# Patient Record
Sex: Male | Born: 1970 | Race: Black or African American | Hispanic: No | Marital: Married | Smoking: Never smoker
Health system: Southern US, Community
[De-identification: ages and names within clinical notes are randomized; demographics above are authoritative.]

## PROBLEM LIST (undated history)

## (undated) DIAGNOSIS — N182 Chronic kidney disease, stage 2 (mild): Secondary | ICD-10-CM

## (undated) DIAGNOSIS — E669 Obesity, unspecified: Secondary | ICD-10-CM

## (undated) DIAGNOSIS — I509 Heart failure, unspecified: Secondary | ICD-10-CM

## (undated) DIAGNOSIS — E119 Type 2 diabetes mellitus without complications: Secondary | ICD-10-CM

## (undated) DIAGNOSIS — I10 Essential (primary) hypertension: Secondary | ICD-10-CM

## (undated) DIAGNOSIS — I2510 Atherosclerotic heart disease of native coronary artery without angina pectoris: Secondary | ICD-10-CM

---

## 2014-04-03 ENCOUNTER — Encounter (HOSPITAL_BASED_OUTPATIENT_CLINIC_OR_DEPARTMENT_OTHER): Payer: Self-pay | Admitting: Emergency Medicine

## 2014-04-03 ENCOUNTER — Emergency Department (HOSPITAL_BASED_OUTPATIENT_CLINIC_OR_DEPARTMENT_OTHER): Payer: Medicaid Other

## 2014-04-03 ENCOUNTER — Emergency Department (HOSPITAL_BASED_OUTPATIENT_CLINIC_OR_DEPARTMENT_OTHER)
Admission: EM | Admit: 2014-04-03 | Discharge: 2014-04-03 | Disposition: A | Discharge status: Home / Self Care | Payer: Medicaid Other | Attending: Emergency Medicine | Admitting: Emergency Medicine

## 2014-04-03 ENCOUNTER — Other Ambulatory Visit: Payer: Self-pay

## 2014-04-03 ENCOUNTER — Other Ambulatory Visit: Payer: Medicaid Other

## 2014-04-03 DIAGNOSIS — R079 Chest pain, unspecified: Secondary | ICD-10-CM | POA: Diagnosis present

## 2014-04-03 DIAGNOSIS — I2510 Atherosclerotic heart disease of native coronary artery without angina pectoris: Secondary | ICD-10-CM | POA: Insufficient documentation

## 2014-04-03 DIAGNOSIS — I509 Heart failure, unspecified: Secondary | ICD-10-CM | POA: Insufficient documentation

## 2014-04-03 DIAGNOSIS — I10 Essential (primary) hypertension: Secondary | ICD-10-CM | POA: Diagnosis not present

## 2014-04-03 DIAGNOSIS — Z79899 Other long term (current) drug therapy: Secondary | ICD-10-CM | POA: Diagnosis not present

## 2014-04-03 DIAGNOSIS — E119 Type 2 diabetes mellitus without complications: Secondary | ICD-10-CM | POA: Diagnosis not present

## 2014-04-03 HISTORY — DX: Atherosclerotic heart disease of native coronary artery without angina pectoris: I25.10

## 2014-04-03 HISTORY — DX: Essential (primary) hypertension: I10

## 2014-04-03 HISTORY — DX: Type 2 diabetes mellitus without complications: E11.9

## 2014-04-03 HISTORY — DX: Heart failure, unspecified: I50.9

## 2014-04-03 LAB — CBC WITH DIFFERENTIAL/PLATELET
BASOS PCT: 0 % (ref 0–1)
Basophils Absolute: 0.1 10*3/uL (ref 0.0–0.1)
EOS ABS: 0.4 10*3/uL (ref 0.0–0.7)
EOS PCT: 3 % (ref 0–5)
HCT: 41.1 % (ref 39.0–52.0)
HEMOGLOBIN: 14.4 g/dL (ref 13.0–17.0)
Lymphocytes Relative: 16 % (ref 12–46)
Lymphs Abs: 1.8 10*3/uL (ref 0.7–4.0)
MCH: 29.7 pg (ref 26.0–34.0)
MCHC: 35 g/dL (ref 30.0–36.0)
MCV: 84.7 fL (ref 78.0–100.0)
Monocytes Absolute: 0.9 10*3/uL (ref 0.1–1.0)
Monocytes Relative: 7 % (ref 3–12)
NEUTROS ABS: 8.5 10*3/uL — AB (ref 1.7–7.7)
Neutrophils Relative %: 74 % (ref 43–77)
Platelets: 174 10*3/uL (ref 150–400)
RBC: 4.85 MIL/uL (ref 4.22–5.81)
RDW: 12.2 % (ref 11.5–15.5)
WBC: 11.6 10*3/uL — AB (ref 4.0–10.5)

## 2014-04-03 LAB — TROPONIN I

## 2014-04-03 LAB — COMPREHENSIVE METABOLIC PANEL
ALT: 44 U/L (ref 0–53)
ANION GAP: 15 (ref 5–15)
AST: 29 U/L (ref 0–37)
Albumin: 3.6 g/dL (ref 3.5–5.2)
Alkaline Phosphatase: 98 U/L (ref 39–117)
BUN: 18 mg/dL (ref 6–23)
CALCIUM: 9 mg/dL (ref 8.4–10.5)
CO2: 26 mEq/L (ref 19–32)
CREATININE: 1.3 mg/dL (ref 0.50–1.35)
Chloride: 102 mEq/L (ref 96–112)
GFR calc Af Amer: 77 mL/min — ABNORMAL LOW (ref >90)
GFR calc non Af Amer: 67 mL/min — ABNORMAL LOW (ref >90)
Glucose, Bld: 117 mg/dL — ABNORMAL HIGH (ref 70–99)
Potassium: 3.3 mEq/L — ABNORMAL LOW (ref 3.7–5.3)
Sodium: 143 mEq/L (ref 137–147)
TOTAL PROTEIN: 7.3 g/dL (ref 6.0–8.3)
Total Bilirubin: 0.7 mg/dL (ref 0.3–1.2)

## 2014-04-03 LAB — PRO B NATRIURETIC PEPTIDE: Pro B Natriuretic peptide (BNP): 1514 pg/mL — ABNORMAL HIGH (ref 0–125)

## 2014-04-03 MED ORDER — ASPIRIN 325 MG PO TABS
325.0000 mg | ORAL_TABLET | Freq: Once | ORAL | Status: AC
  Administered 2014-04-03: 325 mg via ORAL
  Filled 2014-04-03: qty 1

## 2014-04-03 MED ORDER — FUROSEMIDE 10 MG/ML IJ SOLN
40.0000 mg | Freq: Once | INTRAMUSCULAR | Status: AC
  Administered 2014-04-03: 40 mg via INTRAVENOUS
  Filled 2014-04-03: qty 4

## 2014-04-03 NOTE — ED Notes (Signed)
Pt reports chest pressure x1 week - reports increased SOB - pt reports newly diagnosed with fluid around his heart and lungs and placed on 'water pills.' EKG obtained.

## 2014-04-03 NOTE — ED Provider Notes (Signed)
CSN: [id number]     Arrival date & time 04/03/14  1520 History  This chart was scribed for Brett Horn, MD by Milly Jakob, ED Scribe. The patient was seen in room MH08/MH08. Patient's care was started at 3:40 PM.     Chief Complaint  Patient presents with  . Chest Pain   The history is provided by the patient. No language interpreter was used.   HPI Comments: Giancarlos Berendt is a 43 y.o. male with a history of HTN who presents to the Emergency Department complaining of gradual onset chest pressure with associated SOB for several months and in a stable pattern for 2 months. These symptoms are exacerbated by exertion (walking 20 yards) and laying down. These symptoms resolve upon rest. He is without other associated symptoms. He reports being seen for this at [facility] 1 week ago. He states that they started him on Lasix in addition to the hydrochlorothiazide that he has taken for 1 year for HTN. He denies fever, emesis, syncope. He reports seeing a PCP at [facility], but that he was unable to get an appointment for this problem.   Past Medical History  Diagnosis Date  . Hypertension   . Diabetes mellitus without complication   . Coronary artery disease   . CHF (congestive heart failure)    History reviewed. No pertinent past surgical history. History reviewed. No pertinent family history. History  Substance Use Topics  . Smoking status: Never Smoker   . Smokeless tobacco: Not on file  . Alcohol Use: No    Review of Systems  10 Systems reviewed and are negative for acute change except as noted in the HPI.   Allergies  Review of patient's allergies indicates no known allergies.  Home Medications   Prior to Admission medications   Medication Sig Start Date End Date Taking? Authorizing Provider  atorvastatin (LIPITOR) 20 MG tablet Take 20 mg by mouth daily.   Yes Historical Provider, MD  furosemide (LASIX) 20 MG tablet Take 20 mg by mouth daily.   Yes Historical  Provider, MD  hydrochlorothiazide (HYDRODIURIL) 25 MG tablet Take 25 mg by mouth daily.   Yes Historical Provider, MD  lisinopril (PRINIVIL,ZESTRIL) 20 MG tablet Take 20 mg by mouth daily.   Yes Historical Provider, MD  metoprolol (LOPRESSOR) 100 MG tablet Take 100 mg by mouth 2 (two) times daily.   Yes Historical Provider, MD  potassium chloride SA (K-DUR,KLOR-CON) 20 MEQ tablet Take 20 mEq by mouth daily.   Yes Historical Provider, MD  sitaGLIPtin-metformin (JANUMET) 50-1000 MG per tablet Take 1 tablet by mouth daily.   Yes Historical Provider, MD   Triage Vitals: BP 180/100  Pulse 73  Temp(Src) 98.4 F (36.9 C) (Oral)  Resp 21  Ht 6\' 1"  (1.854 m)  Wt 364 lb (165.109 kg)  BMI 48.03 kg/m2  SpO2 93% Physical Exam  Nursing note and vitals reviewed. Constitutional:  Awake, alert, nontoxic appearance.  HENT:  Head: Atraumatic.  Eyes: Right eye exhibits no discharge. Left eye exhibits no discharge.  Neck: Neck supple.  Cardiovascular: Normal rate, regular rhythm and normal heart sounds.   No murmur heard. Pulmonary/Chest: Effort normal and breath sounds normal. No respiratory distress. He has no wheezes. He has no rales. He exhibits no tenderness.  Abdominal: Soft. Bowel sounds are normal. He exhibits no distension. There is no tenderness. There is no rebound and no guarding.  Musculoskeletal: He exhibits no tenderness.  Baseline ROM, no obvious new focal weakness.  Neurological:  Mental status and motor strength appears baseline for patient and situation.  Skin: No rash noted.  Psychiatric: He has a normal mood and affect.    ED Course  Procedures (including critical care time) DIAGNOSTIC STUDIES: Oxygen Saturation is 93% on room air, adquate by my interpretation.    COORDINATION OF CARE: Patient / Family / Caregiver understand and agree with initial ED impression and plan with expectations set for ED visit.  Patient / Family / Caregiver informed of clinical course,  understand medical decision-making process, and agree with plan. D/w Cards for OutPt f/u. Labs Review Labs Reviewed  COMPREHENSIVE METABOLIC PANEL - Abnormal; Notable for the following:    Potassium 3.3 (*)    Glucose, Bld 117 (*)    GFR calc non Af Amer 67 (*)    GFR calc Af Amer 77 (*)    All other components within normal limits  CBC WITH DIFFERENTIAL - Abnormal; Notable for the following:    WBC 11.6 (*)    Neutro Abs 8.5 (*)    All other components within normal limits  PRO B NATRIURETIC PEPTIDE - Abnormal; Notable for the following:    Pro B Natriuretic peptide (BNP) 1514.0 (*)    All other components within normal limits  TROPONIN I    Imaging Review Dg Chest 2 View  04/03/2014   CLINICAL DATA:  Chronic shortness of breath, now increased with sitting and standing. Chest pressure.  EXAM: CHEST  2 VIEW  COMPARISON:  03/28/2014.  FINDINGS: Trachea is midline. Heart size normal. Lungs are somewhat low in volume. There may be mild bibasilar interstitial prominence and indistinctness. Tiny bilateral effusions.  IMPRESSION: Question mild dependent pulmonary edema with tiny bilateral effusions.   Electronically Signed   By: Leanna BattlesMelinda  Blietz M.D.   On: 04/03/2014 16:16     EKG Interpretation None     Muse not working: Sinus rhythm with first degree AV block, ventricular rate 75, borderline right axis deviation, septal Q waves, T wave abnormality inferior leads, no comparison ECG available MDM   Final diagnoses:  Chronic heart failure, unspecified heart failure type    I doubt any other EMC precluding discharge at this time including, but not necessarily limited to the following:AMI, ACS.  I personally performed the services described in this documentation, which was scribed in my presence. The recorded information has been reviewed and is accurate.    Brett HornJohn M Liberato Stansbery, MD 04/04/14 2206

## 2015-10-24 ENCOUNTER — Inpatient Hospital Stay (HOSPITAL_BASED_OUTPATIENT_CLINIC_OR_DEPARTMENT_OTHER)
Admission: EM | Admit: 2015-10-24 | Discharge: 2015-10-26 | DRG: 280 | Disposition: A | Discharge status: Home / Self Care | Payer: Medicaid Other | Attending: Internal Medicine | Admitting: Internal Medicine

## 2015-10-24 ENCOUNTER — Emergency Department (HOSPITAL_BASED_OUTPATIENT_CLINIC_OR_DEPARTMENT_OTHER): Payer: Medicaid Other

## 2015-10-24 ENCOUNTER — Encounter (HOSPITAL_BASED_OUTPATIENT_CLINIC_OR_DEPARTMENT_OTHER): Payer: Self-pay | Admitting: Emergency Medicine

## 2015-10-24 DIAGNOSIS — Z6841 Body Mass Index (BMI) 40.0 and over, adult: Secondary | ICD-10-CM | POA: Diagnosis not present

## 2015-10-24 DIAGNOSIS — I248 Other forms of acute ischemic heart disease: Secondary | ICD-10-CM | POA: Diagnosis present

## 2015-10-24 DIAGNOSIS — R51 Headache: Secondary | ICD-10-CM | POA: Diagnosis present

## 2015-10-24 DIAGNOSIS — I440 Atrioventricular block, first degree: Secondary | ICD-10-CM | POA: Diagnosis present

## 2015-10-24 DIAGNOSIS — E785 Hyperlipidemia, unspecified: Secondary | ICD-10-CM | POA: Diagnosis present

## 2015-10-24 DIAGNOSIS — R0602 Shortness of breath: Secondary | ICD-10-CM | POA: Diagnosis present

## 2015-10-24 DIAGNOSIS — Z7984 Long term (current) use of oral hypoglycemic drugs: Secondary | ICD-10-CM | POA: Diagnosis not present

## 2015-10-24 DIAGNOSIS — E1165 Type 2 diabetes mellitus with hyperglycemia: Secondary | ICD-10-CM | POA: Diagnosis present

## 2015-10-24 DIAGNOSIS — R7989 Other specified abnormal findings of blood chemistry: Secondary | ICD-10-CM

## 2015-10-24 DIAGNOSIS — I130 Hypertensive heart and chronic kidney disease with heart failure and stage 1 through stage 4 chronic kidney disease, or unspecified chronic kidney disease: Secondary | ICD-10-CM | POA: Diagnosis present

## 2015-10-24 DIAGNOSIS — Z9111 Patient's noncompliance with dietary regimen: Secondary | ICD-10-CM | POA: Diagnosis not present

## 2015-10-24 DIAGNOSIS — R0600 Dyspnea, unspecified: Secondary | ICD-10-CM | POA: Diagnosis not present

## 2015-10-24 DIAGNOSIS — I5031 Acute diastolic (congestive) heart failure: Secondary | ICD-10-CM | POA: Insufficient documentation

## 2015-10-24 DIAGNOSIS — E119 Type 2 diabetes mellitus without complications: Secondary | ICD-10-CM

## 2015-10-24 DIAGNOSIS — F329 Major depressive disorder, single episode, unspecified: Secondary | ICD-10-CM | POA: Diagnosis present

## 2015-10-24 DIAGNOSIS — J9601 Acute respiratory failure with hypoxia: Secondary | ICD-10-CM | POA: Diagnosis not present

## 2015-10-24 DIAGNOSIS — M7989 Other specified soft tissue disorders: Secondary | ICD-10-CM | POA: Diagnosis present

## 2015-10-24 DIAGNOSIS — I5033 Acute on chronic diastolic (congestive) heart failure: Secondary | ICD-10-CM | POA: Diagnosis present

## 2015-10-24 DIAGNOSIS — R05 Cough: Secondary | ICD-10-CM | POA: Diagnosis present

## 2015-10-24 DIAGNOSIS — J9600 Acute respiratory failure, unspecified whether with hypoxia or hypercapnia: Secondary | ICD-10-CM | POA: Diagnosis present

## 2015-10-24 DIAGNOSIS — D72829 Elevated white blood cell count, unspecified: Secondary | ICD-10-CM | POA: Diagnosis present

## 2015-10-24 DIAGNOSIS — I2510 Atherosclerotic heart disease of native coronary artery without angina pectoris: Secondary | ICD-10-CM | POA: Diagnosis present

## 2015-10-24 DIAGNOSIS — I10 Essential (primary) hypertension: Secondary | ICD-10-CM | POA: Diagnosis present

## 2015-10-24 DIAGNOSIS — E669 Obesity, unspecified: Secondary | ICD-10-CM | POA: Diagnosis present

## 2015-10-24 DIAGNOSIS — Z9114 Patient's other noncompliance with medication regimen: Secondary | ICD-10-CM

## 2015-10-24 DIAGNOSIS — E1122 Type 2 diabetes mellitus with diabetic chronic kidney disease: Secondary | ICD-10-CM | POA: Diagnosis present

## 2015-10-24 DIAGNOSIS — I5030 Unspecified diastolic (congestive) heart failure: Secondary | ICD-10-CM | POA: Diagnosis not present

## 2015-10-24 DIAGNOSIS — N182 Chronic kidney disease, stage 2 (mild): Secondary | ICD-10-CM | POA: Diagnosis not present

## 2015-10-24 DIAGNOSIS — R778 Other specified abnormalities of plasma proteins: Secondary | ICD-10-CM

## 2015-10-24 DIAGNOSIS — I214 Non-ST elevation (NSTEMI) myocardial infarction: Secondary | ICD-10-CM | POA: Diagnosis present

## 2015-10-24 DIAGNOSIS — I509 Heart failure, unspecified: Secondary | ICD-10-CM | POA: Diagnosis not present

## 2015-10-24 DIAGNOSIS — Z79899 Other long term (current) drug therapy: Secondary | ICD-10-CM

## 2015-10-24 HISTORY — DX: Obesity, unspecified: E66.9

## 2015-10-24 HISTORY — DX: Chronic kidney disease, stage 2 (mild): N18.2

## 2015-10-24 LAB — BASIC METABOLIC PANEL
ANION GAP: 10 (ref 5–15)
BUN: 24 mg/dL — ABNORMAL HIGH (ref 6–20)
CHLORIDE: 100 mmol/L — AB (ref 101–111)
CO2: 26 mmol/L (ref 22–32)
Calcium: 9.2 mg/dL (ref 8.9–10.3)
Creatinine, Ser: 1.3 mg/dL — ABNORMAL HIGH (ref 0.61–1.24)
GFR calc non Af Amer: >60 mL/min (ref >60)
Glucose, Bld: 267 mg/dL — ABNORMAL HIGH (ref 65–99)
POTASSIUM: 3.8 mmol/L (ref 3.5–5.1)
Sodium: 136 mmol/L (ref 135–145)

## 2015-10-24 LAB — BRAIN NATRIURETIC PEPTIDE: B NATRIURETIC PEPTIDE 5: 245.9 pg/mL — AB (ref 0.0–100.0)

## 2015-10-24 LAB — CBC
HEMATOCRIT: 44 % (ref 39.0–52.0)
HEMOGLOBIN: 14.7 g/dL (ref 13.0–17.0)
MCH: 28.8 pg (ref 26.0–34.0)
MCHC: 33.4 g/dL (ref 30.0–36.0)
MCV: 86.3 fL (ref 78.0–100.0)
PLATELETS: 208 10*3/uL (ref 150–400)
RBC: 5.1 MIL/uL (ref 4.22–5.81)
RDW: 12.4 % (ref 11.5–15.5)
WBC: 10.8 10*3/uL — AB (ref 4.0–10.5)

## 2015-10-24 LAB — TROPONIN I: Troponin I: 0.2 ng/mL — ABNORMAL HIGH (ref <0.031)

## 2015-10-24 MED ORDER — FUROSEMIDE 10 MG/ML IJ SOLN
40.0000 mg | Freq: Once | INTRAMUSCULAR | Status: AC
  Administered 2015-10-24: 40 mg via INTRAVENOUS
  Filled 2015-10-24: qty 4

## 2015-10-24 MED ORDER — ASPIRIN 81 MG PO CHEW
324.0000 mg | CHEWABLE_TABLET | Freq: Once | ORAL | Status: AC
  Administered 2015-10-24: 324 mg via ORAL
  Filled 2015-10-24: qty 4

## 2015-10-24 NOTE — Progress Notes (Signed)
Patient ID: Brett Flores, male   DOB: [birth year], 45 y.o.   MRN: [id number]   Please call the floor manager at extension [phone] when the patient arrives to [facility]. Accepted to [facility] hospital to a telemetry bed.  Per notes from [facility].  Brett Flores is a 45 y.o. male with PMH significant for HTN, DM, CAD, CHF, obesity who presents with 4-5 day gradual onset, severe, intermittent SOB that is worse lying flat and with exertion and relieved with rest. Associated symptoms include mild wheezing, chest tightness, mild HA, fatigue, cough, and bilateral lower extremity edema. Denies fever, N/V, abdominal pain, or urinary symptoms. He reports he takes Lasix, Norvasc, HCTZ, lisinopril, and metoprolol; however, he has not had any of his medications for the past month because he states he has forgotten to take them.  Vent. rate 75 BPM PR interval 222 ms QRS duration 106 ms QT/QTc 430/480 ms P-R-T axes 35 -84 77 Sinus rhythm with 1st degree A-V block Left axis deviation Possible Anterior infarct , age undetermined Abnormal ECG  Brain natriuretic peptide [[id number]] (Abnormal) Collected: 10/24/15 2013    Updated: 10/24/15 2118    Specimen Type: Blood     B Natriuretic Peptide 245.9 (H) pg/mL   Troponin I [[id number]] (Abnormal) Collected: 10/24/15 2013   Updated: 10/24/15 2052    Specimen Type: Blood     Troponin I 0.20 (H) ng/mL   Basic metabolic panel [[id number]] (Abnormal) Collected: 10/24/15 2013   Updated: 10/24/15 2051    Specimen Type: Blood     Sodium 136 mmol/L    Potassium 3.8 mmol/L    Chloride 100 (L) mmol/L    CO2 26 mmol/L    Glucose, Bld 267 (H) mg/dL    BUN 24 (H) mg/dL    Creatinine, Ser 8.41 (H) mg/dL    Calcium 9.2 mg/dL    GFR calc non Af Amer >60 mL/min    GFR calc Af Amer >60 mL/min    Anion gap 10   CBC [[id number]] (Abnormal) Collected: 10/24/15 2013   Updated: 10/24/15 2021    Specimen Type: Blood     WBC 10.8 (H) K/uL    RBC 5.10 MIL/uL     Hemoglobin 14.7 g/dL    HCT 25.3 %    MCV 66.4 fL    MCH 28.8 pg    MCHC 33.4 g/dL    RDW 40.3 %    Platelets 208 K/uL   CLINICAL DATA: 45 year old male with shortness of breath  EXAM: CHEST 2 VIEW  COMPARISON: Radiograph dated 04/03/2014  FINDINGS: Two views of the chest do not demonstrate a focal consolidation. There are blunting of the costophrenic angles bilaterally which may represent small pleural effusion. No pneumothorax. There is stable cardiomegaly. No acute osseous pathology.  IMPRESSION: Small bilateral pleural effusions. No focal consolidation or Pneumothorax.  Electronically Signed  By: Elgie Collard M.D.  On: 10/24/2015 20:25  Sanda Klein, MD

## 2015-10-24 NOTE — ED Notes (Addendum)
SOB when he lays down x4-5 days.  Also increasing SOB when he walks.  Went to pmd today and they sent him to [facility], but they were so busy he decided to come here instead.    Breath sounds are clear.  Pt in NAD.

## 2015-10-24 NOTE — ED Provider Notes (Signed)
CSN: [id number]     Arrival date & time 10/24/15  1722 History   First MD Initiated Contact with Patient 10/24/15 2040     Chief Complaint  Patient presents with  . Shortness of Breath     (Consider location/radiation/quality/duration/timing/severity/associated sxs/prior Treatment) HPI   Brett Flores is a 45 y.o. male with PMH significant for HTN, DM, CAD, CHF, obesity who presents with 4-5 day gradual onset, severe, intermittent SOB that is worse lying flat and with exertion and relieved with rest.  Associated symptoms include mild wheezing, chest tightness, mild HA, fatigue, cough, and bilateral lower extremity edema.  Denies fever, N/V, abdominal pain, or urinary symptoms.  He reports he takes Lasix, Norvasc, HCTZ, lisinopril, and metoprolol; however, he has not had any of his medications for the past month because he states he has forgotten to take them.   Past Medical History  Diagnosis Date  . Hypertension   . Diabetes mellitus without complication (HCC)   . Coronary artery disease   . CHF (congestive heart failure) (HCC)   . Obesity    History reviewed. No pertinent past surgical history. No family history on file. Social History  Substance Use Topics  . Smoking status: Never Smoker   . Smokeless tobacco: None  . Alcohol Use: No    Review of Systems All other systems negative unless otherwise stated in HPI    Allergies  Review of patient's allergies indicates no known allergies.  Home Medications   Prior to Admission medications   Medication Sig Start Date End Date Taking? Authorizing Provider  amLODipine (NORVASC) 10 MG tablet Take 10 mg by mouth daily.   Yes Historical Provider, MD  atorvastatin (LIPITOR) 20 MG tablet Take 20 mg by mouth daily.    Historical Provider, MD  furosemide (LASIX) 20 MG tablet Take 20 mg by mouth daily.    Historical Provider, MD  hydrochlorothiazide (HYDRODIURIL) 25 MG tablet Take 25 mg by mouth daily.    Historical Provider, MD   lisinopril (PRINIVIL,ZESTRIL) 20 MG tablet Take 20 mg by mouth daily.    Historical Provider, MD  metoprolol (LOPRESSOR) 100 MG tablet Take 100 mg by mouth 2 (two) times daily.    Historical Provider, MD  potassium chloride SA (K-DUR,KLOR-CON) 20 MEQ tablet Take 20 mEq by mouth daily.    Historical Provider, MD  sitaGLIPtin-metformin (JANUMET) 50-1000 MG per tablet Take 1 tablet by mouth daily.    Historical Provider, MD   BP 146/96 mmHg  Pulse 72  Temp(Src) 97.6 F (36.4 C) (Oral)  Resp 20  Ht  (1.854 m)  Wt 159.666 kg  BMI 46.45 kg/m2  SpO2 96% Physical Exam  Constitutional: He is oriented to person, place, and time. He appears well-developed and well-nourished.  Morbidly obese african [place] male.   HENT:  Head: Normocephalic and atraumatic.  Mouth/Throat: Oropharynx is clear and moist.  Eyes: Conjunctivae are normal. Pupils are equal, round, and reactive to light.  Neck: Normal range of motion. Neck supple. No JVD present.  Cardiovascular: Normal rate, regular rhythm and normal heart sounds.   No murmur heard. 1+ pitting bilateral lower extremity pitting edema up to the knees.   Pulmonary/Chest: Effort normal and breath sounds normal. No accessory muscle usage or stridor. No respiratory distress. He has no wheezes. He has no rhonchi. He has no rales.  Abdominal: Soft. Bowel sounds are normal. He exhibits no distension. There is no tenderness. There is no rebound and no guarding.  Musculoskeletal: Normal range  of motion.  Lymphadenopathy:    He has no cervical adenopathy.  Neurological: He is alert and oriented to person, place, and time.  Speech clear without dysarthria.  Skin: Skin is warm and dry.  Psychiatric: He has a normal mood and affect. His behavior is normal.    ED Course  Procedures (including critical care time) Labs Review Labs Reviewed  BASIC METABOLIC PANEL - Abnormal; Notable for the following:    Chloride 100 (*)    Glucose, Bld 267 (*)    BUN  24 (*)    Creatinine, Ser 1.30 (*)    All other components within normal limits  CBC - Abnormal; Notable for the following:    WBC 10.8 (*)    All other components within normal limits  TROPONIN I - Abnormal; Notable for the following:    Troponin I 0.20 (*)    All other components within normal limits  BRAIN NATRIURETIC PEPTIDE - Abnormal; Notable for the following:    B Natriuretic Peptide 245.9 (*)    All other components within normal limits    Imaging Review Dg Chest 2 View  10/24/2015  CLINICAL DATA:  45 year old male with shortness of breath EXAM: CHEST  2 VIEW COMPARISON:  Radiograph dated 04/03/2014 FINDINGS: Two views of the chest do not demonstrate a focal consolidation. There are blunting of the costophrenic angles bilaterally which may represent small pleural effusion. No pneumothorax. There is stable cardiomegaly. No acute osseous pathology. IMPRESSION: Small bilateral pleural effusions. No focal consolidation or pneumothorax. Electronically Signed   By: Elgie Collard M.D.   On: 10/24/2015 20:25   I have personally reviewed and evaluated these images and lab results as part of my medical decision-making.   EKG Interpretation None      MDM   Final diagnoses:  Shortness of breath  Acute on chronic congestive heart failure, unspecified congestive heart failure type [facility])    Patient presents with worsening SOB, DOE, and orthopnea.  Hx of CHF, HTN, CAD.  Has been noncompliant with his medications for the past month.  On exam, No JVD.  Heart RRR, lungs CTAB, abdomen soft and nontender.  1+ pitting edema of bilateral lower extremities.  Troponin 0.2.  EKG shows SR with first degree AV block and LAD, no acute changes.  CXR shows small bilateral pleural effusions.  No focal consolidation or PTX. BNP 245.9.  Patient given ASA.  Will consult cardiology. CBC remarkable for mild leukocytosis, 10.8.   BMP remarkable for Cr 1.3, baseline  Per Dr. Tresa Endo with cardiology, elevated  troponin likely secondary to heart failure and not ACS.  Will not start IV heparin at this time.  Patient given 40 mg IV lasix.  Plan to admit to medicine.   Admit to telemetry at [facility] as Dr. Robb Matar admitting physician for IV diuresis and serial enzyme monitoring.  Case has been discussed with and seen by Dr. Dalene Seltzer who agrees with the above plan for admission.     Cheri Fowler, PA-C 10/24/15 2152  Alvira Monday, MD 10/26/15 [phone]

## 2015-10-25 ENCOUNTER — Encounter (HOSPITAL_COMMUNITY): Payer: Self-pay | Admitting: Internal Medicine

## 2015-10-25 DIAGNOSIS — R0602 Shortness of breath: Secondary | ICD-10-CM | POA: Diagnosis present

## 2015-10-25 DIAGNOSIS — I214 Non-ST elevation (NSTEMI) myocardial infarction: Secondary | ICD-10-CM

## 2015-10-25 DIAGNOSIS — R778 Other specified abnormalities of plasma proteins: Secondary | ICD-10-CM | POA: Diagnosis present

## 2015-10-25 DIAGNOSIS — E119 Type 2 diabetes mellitus without complications: Secondary | ICD-10-CM

## 2015-10-25 DIAGNOSIS — R7989 Other specified abnormal findings of blood chemistry: Secondary | ICD-10-CM | POA: Diagnosis present

## 2015-10-25 DIAGNOSIS — I10 Essential (primary) hypertension: Secondary | ICD-10-CM

## 2015-10-25 DIAGNOSIS — I5030 Unspecified diastolic (congestive) heart failure: Secondary | ICD-10-CM

## 2015-10-25 DIAGNOSIS — E669 Obesity, unspecified: Secondary | ICD-10-CM | POA: Diagnosis present

## 2015-10-25 DIAGNOSIS — I5031 Acute diastolic (congestive) heart failure: Secondary | ICD-10-CM | POA: Insufficient documentation

## 2015-10-25 DIAGNOSIS — N182 Chronic kidney disease, stage 2 (mild): Secondary | ICD-10-CM | POA: Diagnosis present

## 2015-10-25 DIAGNOSIS — I2510 Atherosclerotic heart disease of native coronary artery without angina pectoris: Secondary | ICD-10-CM | POA: Diagnosis present

## 2015-10-25 DIAGNOSIS — R0600 Dyspnea, unspecified: Secondary | ICD-10-CM

## 2015-10-25 DIAGNOSIS — I509 Heart failure, unspecified: Secondary | ICD-10-CM

## 2015-10-25 DIAGNOSIS — J9600 Acute respiratory failure, unspecified whether with hypoxia or hypercapnia: Secondary | ICD-10-CM | POA: Diagnosis present

## 2015-10-25 LAB — TROPONIN I
TROPONIN I: 0.13 ng/mL — AB (ref <0.031)
Troponin I: 0.1 ng/mL — ABNORMAL HIGH (ref <0.031)
Troponin I: 0.1 ng/mL — ABNORMAL HIGH (ref <0.031)

## 2015-10-25 LAB — GLUCOSE, CAPILLARY
GLUCOSE-CAPILLARY: 171 mg/dL — AB (ref 65–99)
GLUCOSE-CAPILLARY: 183 mg/dL — AB (ref 65–99)
GLUCOSE-CAPILLARY: 213 mg/dL — AB (ref 65–99)
GLUCOSE-CAPILLARY: 237 mg/dL — AB (ref 65–99)
GLUCOSE-CAPILLARY: 262 mg/dL — AB (ref 65–99)

## 2015-10-25 LAB — RAPID URINE DRUG SCREEN, HOSP PERFORMED
AMPHETAMINES: NOT DETECTED
BARBITURATES: NOT DETECTED
Benzodiazepines: NOT DETECTED
Cocaine: NOT DETECTED
OPIATES: NOT DETECTED
TETRAHYDROCANNABINOL: NOT DETECTED

## 2015-10-25 LAB — APTT: APTT: 26 s (ref 24–37)

## 2015-10-25 LAB — PROTIME-INR
INR: 1.08 (ref 0.00–1.49)
Prothrombin Time: 14.2 seconds (ref 11.6–15.2)

## 2015-10-25 LAB — LIPID PANEL
CHOLESTEROL: 200 mg/dL (ref 0–200)
HDL: 41 mg/dL (ref >40)
LDL Cholesterol: 106 mg/dL — ABNORMAL HIGH (ref 0–99)
TRIGLYCERIDES: 263 mg/dL — AB (ref <150)
Total CHOL/HDL Ratio: 4.9 RATIO
VLDL: 53 mg/dL — ABNORMAL HIGH (ref 0–40)

## 2015-10-25 LAB — TSH: TSH: 5.188 u[IU]/mL — ABNORMAL HIGH (ref 0.350–4.500)

## 2015-10-25 LAB — HIV ANTIBODY (ROUTINE TESTING W REFLEX): HIV SCREEN 4TH GENERATION: NONREACTIVE

## 2015-10-25 MED ORDER — METOPROLOL TARTRATE 100 MG PO TABS
100.0000 mg | ORAL_TABLET | Freq: Two times a day (BID) | ORAL | Status: DC
Start: 2015-10-25 — End: 2015-10-26
  Administered 2015-10-25 – 2015-10-26 (×4): 100 mg via ORAL
  Filled 2015-10-25 (×4): qty 1

## 2015-10-25 MED ORDER — ACETAMINOPHEN 325 MG PO TABS: 650.0000 mg | ORAL_TABLET | ORAL | Status: DC | PRN

## 2015-10-25 MED ORDER — ATORVASTATIN CALCIUM 20 MG PO TABS
20.0000 mg | ORAL_TABLET | Freq: Every day | ORAL | Status: DC
  Administered 2015-10-25 – 2015-10-26 (×2): 20 mg via ORAL
  Filled 2015-10-25 (×2): qty 1

## 2015-10-25 MED ORDER — FUROSEMIDE 10 MG/ML IJ SOLN
40.0000 mg | Freq: Every day | INTRAMUSCULAR | Status: DC
  Administered 2015-10-25: 40 mg via INTRAVENOUS
  Filled 2015-10-25 (×2): qty 4

## 2015-10-25 MED ORDER — NITROGLYCERIN 0.4 MG SL SUBL: 0.4000 mg | SUBLINGUAL_TABLET | SUBLINGUAL | Status: DC | PRN

## 2015-10-25 MED ORDER — MORPHINE SULFATE (PF) 2 MG/ML IV SOLN: 2.0000 mg | INTRAVENOUS | Status: DC | PRN

## 2015-10-25 MED ORDER — AZITHROMYCIN 250 MG PO TABS
500.0000 mg | ORAL_TABLET | Freq: Every day | ORAL | Status: AC
  Administered 2015-10-25: 500 mg via ORAL
  Filled 2015-10-25: qty 2

## 2015-10-25 MED ORDER — ALBUTEROL SULFATE (2.5 MG/3ML) 0.083% IN NEBU: 2.5000 mg | INHALATION_SOLUTION | RESPIRATORY_TRACT | Status: DC | PRN

## 2015-10-25 MED ORDER — AZITHROMYCIN 250 MG PO TABS: 250.0000 mg | ORAL_TABLET | Freq: Every day | ORAL | Status: DC

## 2015-10-25 MED ORDER — SODIUM CHLORIDE 0.9 % IV SOLN: 250.0000 mL | INTRAVENOUS | Status: DC | PRN

## 2015-10-25 MED ORDER — AMLODIPINE BESYLATE 10 MG PO TABS
10.0000 mg | ORAL_TABLET | Freq: Every day | ORAL | Status: DC
  Administered 2015-10-25 – 2015-10-26 (×3): 10 mg via ORAL
  Filled 2015-10-25 (×3): qty 1

## 2015-10-25 MED ORDER — LISINOPRIL 20 MG PO TABS
20.0000 mg | ORAL_TABLET | Freq: Every day | ORAL | Status: DC
  Administered 2015-10-25 – 2015-10-26 (×3): 20 mg via ORAL
  Filled 2015-10-25 (×3): qty 1

## 2015-10-25 MED ORDER — INSULIN ASPART 100 UNIT/ML ~~LOC~~ SOLN
0.0000 [IU] | Freq: Three times a day (TID) | SUBCUTANEOUS | Status: DC
  Administered 2015-10-25: 2 [IU] via SUBCUTANEOUS
  Administered 2015-10-25: 3 [IU] via SUBCUTANEOUS
  Administered 2015-10-25: 2 [IU] via SUBCUTANEOUS
  Administered 2015-10-26: 3 [IU] via SUBCUTANEOUS
  Administered 2015-10-26: 5 [IU] via SUBCUTANEOUS

## 2015-10-25 MED ORDER — INSULIN ASPART 100 UNIT/ML ~~LOC~~ SOLN
0.0000 [IU] | Freq: Every day | SUBCUTANEOUS | Status: DC
  Administered 2015-10-25: 2 [IU] via SUBCUTANEOUS

## 2015-10-25 MED ORDER — SODIUM CHLORIDE 0.9% FLUSH
3.0000 mL | INTRAVENOUS | Status: DC | PRN
  Administered 2015-10-25: 3 mL via INTRAVENOUS
  Filled 2015-10-25: qty 3

## 2015-10-25 MED ORDER — ENOXAPARIN SODIUM 80 MG/0.8ML ~~LOC~~ SOLN
80.0000 mg | SUBCUTANEOUS | Status: DC
  Administered 2015-10-25 (×2): 80 mg via SUBCUTANEOUS
  Filled 2015-10-25 (×2): qty 0.8

## 2015-10-25 MED ORDER — DM-GUAIFENESIN ER 30-600 MG PO TB12
1.0000 | ORAL_TABLET | Freq: Two times a day (BID) | ORAL | Status: DC
  Administered 2015-10-25 – 2015-10-26 (×4): 1 via ORAL
  Filled 2015-10-25 (×4): qty 1

## 2015-10-25 MED ORDER — ASPIRIN EC 325 MG PO TBEC
325.0000 mg | DELAYED_RELEASE_TABLET | Freq: Every day | ORAL | Status: DC
  Administered 2015-10-25 – 2015-10-26 (×2): 325 mg via ORAL
  Filled 2015-10-25 (×2): qty 1

## 2015-10-25 MED ORDER — SODIUM CHLORIDE 0.9% FLUSH
3.0000 mL | Freq: Two times a day (BID) | INTRAVENOUS | Status: DC
  Administered 2015-10-25 – 2015-10-26 (×4): 3 mL via INTRAVENOUS

## 2015-10-25 MED ORDER — HYDRALAZINE HCL 20 MG/ML IJ SOLN: 5.0000 mg | INTRAMUSCULAR | Status: DC | PRN

## 2015-10-25 NOTE — Consult Note (Signed)
Reason for Consult: dyspnea, concern for acs vs. Heart failure Primary Cardiologist: new Referring Physician: Kealan Buchan is an 45 y.o. male.  HPI: Mr. Kazmi is a 45 yo man with PMH of hypertension, T2DM,, obesity and heart failure who presented to [place] with 4-5 days of dyspnea with exertion as well as positional (lying flat) orthopnea that improves with rest and changes in position. He also endorses some associated wheezing, chest tightness, fatigue and lower extremity swelling. He has occasional mild headaches and cough as well. He denies infectious symptoms of nausea/vomiting/diarrhea/fevers/chills. He is compliant with his medications including lasix, amlodipine, hctz, metoprolol and lisinopril until ~ 1 month ago when he ran out of medications.   He denies current chest pain. He denies PMH of CAD and doesn't endorse the HF diagnosis. He says he feels ok at rest. His troponins were 0.2 to 0.13.   He was quite hypertensive in the ER.      Past Medical History  Diagnosis Date  . Hypertension   . Diabetes mellitus without complication ([place])   . Coronary artery disease   . CHF (congestive heart failure) ([place])   . Obesity   . CKD (chronic kidney disease), stage II     History reviewed. No pertinent past surgical history.  No family history on file.  Social History:  reports that he has never smoked. He does not have any smokeless tobacco history on file. He reports that he does not drink alcohol or use illicit drugs.  Allergies: No Known Allergies  Medications:  I have reviewed the patient's current medications. Prior to Admission:  Prescriptions prior to admission  Medication Sig Dispense Refill Last Dose  . amLODipine (NORVASC) 10 MG tablet Take 10 mg by mouth daily.     Marland Kitchen atorvastatin (LIPITOR) 20 MG tablet Take 20 mg by mouth daily.     . furosemide (LASIX) 20 MG tablet Take 20 mg by mouth daily.     . hydrochlorothiazide (HYDRODIURIL) 25 MG  tablet Take 25 mg by mouth daily.     Marland Kitchen lisinopril (PRINIVIL,ZESTRIL) 20 MG tablet Take 20 mg by mouth daily.     . metoprolol (LOPRESSOR) 100 MG tablet Take 100 mg by mouth 2 (two) times daily.     . potassium chloride SA (K-DUR,KLOR-CON) 20 MEQ tablet Take 20 mEq by mouth daily.     . sitaGLIPtin-metformin (JANUMET) 50-1000 MG per tablet Take 1 tablet by mouth daily.      Scheduled: . amLODipine  10 mg Oral Daily  . aspirin EC  325 mg Oral Daily  . atorvastatin  20 mg Oral Daily  . azithromycin  500 mg Oral Daily   Followed by  . [START ON 10/26/2015] azithromycin  250 mg Oral Daily  . dextromethorphan-guaiFENesin  1 tablet Oral BID  . enoxaparin (LOVENOX) injection  80 mg Subcutaneous Q24H  . furosemide  40 mg Intravenous Daily  . insulin aspart  0-5 Units Subcutaneous QHS  . insulin aspart  0-9 Units Subcutaneous TID WC  . lisinopril  20 mg Oral Daily  . metoprolol  100 mg Oral BID  . sodium chloride flush  3 mL Intravenous Q12H    Results for orders placed or performed during the hospital encounter of 10/24/15 (from the past 48 hour(s))  Basic metabolic panel     Status: Abnormal   Collection Time: 10/24/15  8:13 PM  Result Value Ref Range   Sodium 136 135 - 145 mmol/L  Potassium 3.8 3.5 - 5.1 mmol/L   Chloride 100 (L) 101 - 111 mmol/L   CO2 26 22 - 32 mmol/L   Glucose, Bld 267 (H) 65 - 99 mg/dL   BUN 24 (H) 6 - 20 mg/dL   Creatinine, Ser 1.30 (H) 0.61 - 1.24 mg/dL   Calcium 9.2 8.9 - 10.3 mg/dL   GFR calc non Af Amer >60 >60 mL/min   GFR calc Af Amer >60 >60 mL/min    Comment: (NOTE) The eGFR has been calculated using the CKD EPI equation. This calculation has not been validated in all clinical situations. eGFR's persistently <60 mL/min signify possible Chronic Kidney Disease.    Anion gap 10 5 - 15  CBC     Status: Abnormal   Collection Time: 10/24/15  8:13 PM  Result Value Ref Range   WBC 10.8 (H) 4.0 - 10.5 K/uL   RBC 5.10 4.22 - 5.81 MIL/uL   Hemoglobin 14.7  13.0 - 17.0 g/dL   HCT 44.0 39.0 - 52.0 %   MCV 86.3 78.0 - 100.0 fL   MCH 28.8 26.0 - 34.0 pg   MCHC 33.4 30.0 - 36.0 g/dL   RDW 12.4 11.5 - 15.5 %   Platelets 208 150 - 400 K/uL  Troponin I     Status: Abnormal   Collection Time: 10/24/15  8:13 PM  Result Value Ref Range   Troponin I 0.20 (H) <0.031 ng/mL    Comment:        PERSISTENTLY INCREASED TROPONIN VALUES IN THE RANGE OF 0.04-0.49 ng/mL CAN BE SEEN IN:       -UNSTABLE ANGINA       -CONGESTIVE HEART FAILURE       -MYOCARDITIS       -CHEST TRAUMA       -ARRYHTHMIAS       -LATE PRESENTING MYOCARDIAL INFARCTION       -COPD   CLINICAL FOLLOW-UP RECOMMENDED.   Brain natriuretic peptide     Status: Abnormal   Collection Time: 10/24/15  8:13 PM  Result Value Ref Range   B Natriuretic Peptide 245.9 (H) 0.0 - 100.0 pg/mL  TSH     Status: Abnormal   Collection Time: 10/25/15 12:12 AM  Result Value Ref Range   TSH 5.188 (H) 0.350 - 4.500 uIU/mL  Protime-INR     Status: None   Collection Time: 10/25/15 12:12 AM  Result Value Ref Range   Prothrombin Time 14.2 11.6 - 15.2 seconds   INR 1.08 0.00 - 1.49  APTT     Status: None   Collection Time: 10/25/15 12:12 AM  Result Value Ref Range   aPTT 26 24 - 37 seconds  Troponin I     Status: Abnormal   Collection Time: 10/25/15 12:12 AM  Result Value Ref Range   Troponin I 0.13 (H) <0.031 ng/mL    Comment:        PERSISTENTLY INCREASED TROPONIN VALUES IN THE RANGE OF 0.04-0.49 ng/mL CAN BE SEEN IN:       -UNSTABLE ANGINA       -CONGESTIVE HEART FAILURE       -MYOCARDITIS       -CHEST TRAUMA       -ARRYHTHMIAS       -LATE PRESENTING MYOCARDIAL INFARCTION       -COPD   CLINICAL FOLLOW-UP RECOMMENDED.   Glucose, capillary     Status: Abnormal   Collection Time: 10/25/15 12:27 AM  Result Value Ref Range  Glucose-Capillary 262 (H) 65 - 99 mg/dL  Lipid panel     Status: Abnormal   Collection Time: 10/25/15 12:35 AM  Result Value Ref Range   Cholesterol 200 0 - 200 mg/dL     Triglycerides 263 (H) <150 mg/dL   HDL 41 >40 mg/dL   Total CHOL/HDL Ratio 4.9 RATIO   VLDL 53 (H) 0 - 40 mg/dL   LDL Cholesterol 106 (H) 0 - 99 mg/dL    Comment:        Total Cholesterol/HDL:CHD Risk Coronary Heart Disease Risk Table                     Men   Women  1/2 Average Risk   3.4   3.3  Average Risk       5.0   4.4  2 X Average Risk   9.6   7.1  3 X Average Risk  23.4   11.0        Use the calculated Patient Ratio above and the CHD Risk Table to determine the patient's CHD Risk.        ATP III CLASSIFICATION (LDL):  <100     mg/dL   Optimal  100-129  mg/dL   Near or Above                    Optimal  130-159  mg/dL   Borderline  160-189  mg/dL   High  >190     mg/dL   Very High     Dg Chest 2 View  10/24/2015  CLINICAL DATA:  45 year old male with shortness of breath EXAM: CHEST  2 VIEW COMPARISON:  Radiograph dated 04/03/2014 FINDINGS: Two views of the chest do not demonstrate a focal consolidation. There are blunting of the costophrenic angles bilaterally which may represent small pleural effusion. No pneumothorax. There is stable cardiomegaly. No acute osseous pathology. IMPRESSION: Small bilateral pleural effusions. No focal consolidation or pneumothorax. Electronically Signed   By: Anner Crete M.D.   On: 10/24/2015 20:25    Review of Systems  Constitutional: Positive for malaise/fatigue. Negative for fever and chills.  HENT: Negative for ear discharge and ear pain.   Eyes: Negative for double vision, photophobia and pain.  Respiratory: Positive for cough and shortness of breath. Negative for hemoptysis.   Cardiovascular: Positive for chest pain, orthopnea and leg swelling.  Gastrointestinal: Negative for nausea, vomiting, diarrhea, blood in stool and melena.  Genitourinary: Negative for dysuria, frequency and hematuria.  Musculoskeletal: Negative for myalgias and neck pain.  Skin: Negative for rash.  Neurological: Positive for headaches. Negative for  dizziness, tingling, tremors and sensory change.  Endo/Heme/Allergies: Negative for polydipsia. Does not bruise/bleed easily.  Psychiatric/Behavioral: Negative for depression, suicidal ideas, hallucinations and substance abuse.   Blood pressure 145/90, pulse 75, temperature 98 F (36.7 C), temperature source Oral, resp. rate 21, height 6' 1"  (1.854 m), weight 166.561 kg (367 lb 3.2 oz), SpO2 88 %. Physical Exam  Nursing note and vitals reviewed. Constitutional: He is oriented to person, place, and time. He appears well-developed and well-nourished. No distress.  HENT:  Head: Normocephalic and atraumatic.  Nose: Nose normal.  Mouth/Throat: Oropharynx is clear and moist. No oropharyngeal exudate.  Eyes: Conjunctivae and EOM are normal. Pupils are equal, round, and reactive to light. No scleral icterus.  Neck: Normal range of motion. Neck supple. JVD present. No tracheal deviation present.  JVP 3 cm above clavicle with +HJR  Cardiovascular: Normal rate,  regular rhythm, normal heart sounds and intact distal pulses.   No murmur heard. Respiratory: Effort normal. No respiratory distress. He has no wheezes. He has rales.  GI: Soft. Bowel sounds are normal. He exhibits no distension. There is no tenderness. There is no rebound.  Musculoskeletal: Normal range of motion. He exhibits edema.  Trace to 1+ LEE bilaterally  Neurological: He is alert and oriented to person, place, and time. No cranial nerve deficit.  Skin: Skin is dry. He is not diaphoretic. No erythema.  Psychiatric: He has a normal mood and affect. His behavior is normal. Thought content normal.   Labs reviewed; wbc 10.8, h/h 14.7/44, plt 208, bun/cr 24/1.3, glucose 267, na 136, K 3.8 Trop 0.2 to 0.13 Chest x-ray: increased vascularity, cardiomegaly EKG: Poor r-wave progression, ST elevation stable V1-V3 from 2015 EKG and sub 1 mm, sinus rhythm  Assessment/Plan: Mr. Drudge is a 45 yo man with PMH of hypertension, T2DM, obesity and  heart failure who presented to [place] with 4-5 days of dyspnea with exertion.  Differential diagnosis includes pneumonia, COPD exacerbation, acute bronchitis, deconditioning, anginal equivalent, heart failure - systolic and/or diastolic among other etiologies. He presented with significant blood pressure elevation suggesting diastolic heart failure as likely etiology of symptoms. However, could be multifactorial. His exam is difficult. Will trial IV lasix, agree with azithromycin for possible bronchitis and will await echocardiogram in AM. Troponins currently flat consistent with demand ischemia Type II NSTEMI clinically; however, will follow closely.  Problem List Dyspnea with exertion Type II NSTEMI Diastolic heart failure - acute on ? Chronic T2DM with hyperglycemia  Plan 1. Trial of IV diuresis 2. Observation on telemetry, trend cardiac biomarkers, if troponins rise sharply then start heparin gtt 3. Echocardiogram in AM 4. Improve blood pressure control; agree with gradual restart of his home bp medications 5. TSH, hba1c, urinalysis, lipid panel 6. Follow-up UOP, labs and exam to determine further need for diuretics  Arron Mcnaught 10/25/2015, 3:46 AM

## 2015-10-25 NOTE — Care Management Note (Signed)
Case Management Note  Patient Details  Name: Brett Flores MRN: [id number] Date of Birth: Aug 09, 1971  Subjective/Objective: Pt admitted for chest pain and SOB. Treating for CHF with IV Lasix.                     Action/Plan: Pt has Medicaid and co pay for medications should be no more than $3.00. CM did reach out to [facility]. Will continue to monitor for additional needs.    Expected Discharge Date:                  Expected Discharge Plan:  Home/Self Care  In-House Referral:  NA  Discharge planning Services  CM Consult  Post Acute Care Choice:  NA Choice offered to:  NA  DME Arranged:    DME Agency:  NA  HH Arranged:  NA HH Agency:  NA  Status of Service:  Completed, signed off  Medicare Important Message Given:    Date Medicare IM Given:    Medicare IM give by:    Date Additional Medicare IM Given:    Additional Medicare Important Message give by:     If discussed at Long Length of Stay Meetings, dates discussed:    Additional Comments:  Gala Lewandowsky, RN 10/25/2015, 12:01 PM

## 2015-10-25 NOTE — Evaluation (Signed)
Physical Therapy Evaluation Patient Details Name: Brett Flores MRN: [id number] DOB: 02/12/1971 Today's Date: 10/25/2015   History of Present Illness  Pt is a 45 y/o M w/ acute CHF, likely diastolic.  Pt's PMH includes obesity, CAD, DM, HTN.  Clinical Impression  Pt admitted with above diagnosis. SpO2 down to 89% on RA while ambulating but quickly returns to 90's after education on pursed lip breathing.  Mr. Portela is at his baseline level of mobility (modified Independent), no skilled PT needs identified. Pt is signing off.     Follow Up Recommendations No PT follow up    Equipment Recommendations  None recommended by PT    Recommendations for Other Services       Precautions / Restrictions Precautions Precaution Comments: watch O2 Restrictions Weight Bearing Restrictions: No      Mobility  Bed Mobility Overal bed mobility: Modified Independent             General bed mobility comments: No cues or physical assist needed. HOB elevated.  Transfers Overall transfer level: Needs assistance Equipment used: None Transfers: Sit to/from Stand Sit to Stand: Independent         General transfer comment: No cues or physical assist needed  Ambulation/Gait Ambulation/Gait assistance: Modified independent (Device/Increase time) Ambulation Distance (Feet): 200 Feet Assistive device: None Gait Pattern/deviations: Step-through pattern;Wide base of support   Gait velocity interpretation: Below normal speed for age/gender General Gait Details: No physical assist needed.  No instability noted w/ high level balance activities listed below.  SpO2 dropped to 89% on RA but quickly returned to and remained in 90's after education on pursed lip breathing.  Stairs Stairs: Yes Stairs assistance: Modified independent (Device/Increase time) Stair Management: One rail Left Number of Stairs: 17 General stair comments: SpO2 remains in high 90's.  Standing rest break at top of  steps.  Wheelchair Mobility    Modified Rankin (Stroke Patients Only)       Balance Overall balance assessment: Independent                           High level balance activites: Head turns;Direction changes;Other (comment) (stepping over object) High Level Balance Comments: No instability noted w/ high level balance activities listed above             Pertinent Vitals/Pain Pain Assessment: No/denies pain    Home Living Family/patient expects to be discharged to:: Private residence Living Arrangements: Children (high school aged son) Available Help at Discharge: Family;Available PRN/intermittently Type of Home: Apartment Home Access: Stairs to enter Entrance Stairs-Rails: Lawyer of Steps: 12 Home Layout: Two level Home Equipment: None      Prior Function Level of Independence: Independent               Hand Dominance        Extremity/Trunk Assessment   Upper Extremity Assessment: Overall WFL for tasks assessed           Lower Extremity Assessment: Overall WFL for tasks assessed         Communication   Communication: No difficulties  Cognition Arousal/Alertness: Awake/alert Behavior During Therapy: WFL for tasks assessed/performed Overall Cognitive Status: Within Functional Limits for tasks assessed                      General Comments General comments (skin integrity, edema, etc.): Pt expresses interest in going to his gym more frequently [employer]).  Pt  encouraged to gradually increase his gym visits, 1x/wk, then 2x/wk, etc.      Exercises        Assessment/Plan    PT Assessment Patent does not need any further PT services  PT Diagnosis Difficulty walking   PT Problem List    PT Treatment Interventions     PT Goals (Current goals can be found in the Care Plan section) Acute Rehab PT Goals Patient Stated Goal: to modify his lifestyle to become more conscious of his health PT  Goal Formulation: All assessment and education complete, DC therapy    Frequency     Barriers to discharge        Co-evaluation               End of Session   Activity Tolerance: Patient tolerated treatment well;Patient limited by fatigue Patient left: in bed;with call bell/phone within reach Nurse Communication: Mobility status;Other (comment) (SpO2)         Time: 1191-4782 PT Time Calculation (min) (ACUTE ONLY): 23 min   Charges:   PT Evaluation $PT Eval Low Complexity: 1 Procedure PT Treatments $Gait Training: 8-22 mins   PT G Codes:       Michail Jewels PT, DPT [phone] Pager: [phone] 10/25/2015, 2:22 PM

## 2015-10-25 NOTE — Evaluation (Signed)
Occupational Therapy Evaluation Patient Details Name: Jahi Roza MRN: [id number] DOB: 1971/06/29 Today's Date: 10/25/2015    History of Present Illness Pt is a 45 y/o M w/ acute CHF, likely diastolic.  Pt's PMH includes obesity, CAD, DM, HTN.   Clinical Impression   Pt is performing at a modified independent level in ADL and ADL transfers. Educated in energy conservation strategies. No OT needs.    Follow Up Recommendations  No OT follow up    Equipment Recommendations  None recommended by OT    Recommendations for Other Services       Precautions / Restrictions Precautions Precaution Comments: watch O2 Restrictions Weight Bearing Restrictions: No      Mobility Bed Mobility Overal bed mobility: Modified Independent             General bed mobility comments: No cues or physical assist needed. HOB elevated.  Transfers Overall transfer level: Modified independent Equipment used: None Transfers: Sit to/from Stand Sit to Stand: Independent         General transfer comment: used momentum, not assist    Balance Overall balance assessment: Independent                                       ADL Overall ADL's : Modified independent                                       General ADL Comments: Educated pt in energy conservation strategies and provided handout to reinforce.     Vision     Perception     Praxis      Pertinent Vitals/Pain Pain Assessment: No/denies pain     Hand Dominance Right   Extremity/Trunk Assessment Upper Extremity Assessment Upper Extremity Assessment: Overall WFL for tasks assessed   Lower Extremity Assessment Lower Extremity Assessment: Overall WFL for tasks assessed       Communication Communication Communication: No difficulties   Cognition Arousal/Alertness: Awake/alert Behavior During Therapy: WFL for tasks assessed/performed Overall Cognitive Status: Within Functional Limits for  tasks assessed                     General Comments       Exercises       Shoulder Instructions      Home Living Family/patient expects to be discharged to:: Private residence Living Arrangements: Children Available Help at Discharge: Family;Available PRN/intermittently Type of Home: Apartment Home Access: Stairs to enter Entrance Stairs-Number of Steps: 12 Entrance Stairs-Rails: Left;Right Home Layout: Two level Alternate Level Stairs-Number of Steps: 12 Alternate Level Stairs-Rails: Left Bathroom Shower/Tub: Chief Strategy Officer: Standard     Home Equipment: None          Prior Functioning/Environment Level of Independence: Independent             OT Diagnosis: Other (comment) (decreased activity tolerance)   OT Problem List:     OT Treatment/Interventions:      OT Goals(Current goals can be found in the care plan section) Acute Rehab OT Goals Patient Stated Goal: to modify his lifestyle to become more conscious of his health  OT Frequency:     Barriers to D/C:            Co-evaluation  End of Session    Activity Tolerance: Patient tolerated treatment well Patient left: in bed;with call bell/phone within reach   Time: 1547-1602 OT Time Calculation (min): 15 min Charges:  OT General Charges $OT Visit: 1 Procedure OT Evaluation $OT Eval Low Complexity: 1 Procedure G-Codes:    Evern Bio 10/25/2015, 4:06 PM  [phone]

## 2015-10-25 NOTE — H&P (Signed)
[facility] History and Physical  Brett Flores WUJ:[id number] DOB: 04/20/1971 DOA: 10/24/2015  Referring physician: ED physician PCP: No PCP Per Patient  Specialists:   Chief Complaint: SOB  HPI: Brett Flores is a 45 y.o. male with PMH of hypertension, hyperlipidemia, diabetes mellitus, obesity, CAD, congestive heart failure (not sure which type of CHF, no 2d echo on record), obesity, chronic kidney disease-stage II, who presents with shortness of breath.  Patient reports that he has been having shortness of breath in the past to 5 days, which has been progressively getting worse. His shortness of breath is worse when he is laying down. He had chest tightness earlier, which has resolved. Currently no chest pain. Patient reports that he has not taken his Lasix for more than a month. He has bilateral leg edema. He used to be on low dose of lasix 20 mg daily. Patient also reports having mild cough with little brownish colored sputum production sometimes. No fever or chills. Patient does not have abdominal pain, diarrhea, symptoms of UTI or unilateral weakness.   In ED, patient was found to have elevated troponin 0.20-->013, BNP 245.9, WBC 10.8, temperature normal, no tachycardia, stable renal function. Chest x-ray showed small bilateral pleural effusion, but no infiltration. Patient is admitted to inpatient for further eval and treatment.  Cardiology was consulted by EDP.  EKG: Independently reviewed. QTC 480, LAD, septal infarction pattern, mild T-wave inversion in lateral leads.  Where does patient live?   At home  Can patient participate in ADLs?  Yes Review of Systems:   General: no fevers, chills, has poor appetite, has fatigue HEENT: no blurry vision, hearing changes or sore throat Pulm: has dyspnea, coughing, no wheezing CV: no chest pain, palpitations Abd: no nausea, vomiting, abdominal pain, diarrhea, constipation GU: no dysuria, burning on urination, increased urinary frequency,  hematuria  Ext: has leg edema Neuro: no unilateral weakness, numbness, or tingling, no vision change or hearing loss Skin: no rash MSK: No muscle spasm, no deformity, no limitation of range of movement in spin Heme: No easy bruising.  Travel history: No recent long distant travel.  Allergy: No Known Allergies  Past Medical History  Diagnosis Date  . Hypertension   . Diabetes mellitus without complication (HCC)   . Coronary artery disease   . CHF (congestive heart failure) (HCC)   . Obesity   . CKD (chronic kidney disease), stage II     History reviewed. No pertinent past surgical history.  Social History:  reports that he has never smoked. He does not have any smokeless tobacco history on file. He reports that he does not drink alcohol or use illicit drugs.  Family History: Reviewed with patient, but patient does not know any family medical history.  Prior to Admission medications   Medication Sig Start Date End Date Taking? Authorizing Provider  amLODipine (NORVASC) 10 MG tablet Take 10 mg by mouth daily.   Yes Historical Provider, MD  atorvastatin (LIPITOR) 20 MG tablet Take 20 mg by mouth daily.    Historical Provider, MD  furosemide (LASIX) 20 MG tablet Take 20 mg by mouth daily.    Historical Provider, MD  hydrochlorothiazide (HYDRODIURIL) 25 MG tablet Take 25 mg by mouth daily.    Historical Provider, MD  lisinopril (PRINIVIL,ZESTRIL) 20 MG tablet Take 20 mg by mouth daily.    Historical Provider, MD  metoprolol (LOPRESSOR) 100 MG tablet Take 100 mg by mouth 2 (two) times daily.    Historical Provider, MD  potassium chloride  SA (K-DUR,KLOR-CON) 20 MEQ tablet Take 20 mEq by mouth daily.    Historical Provider, MD  sitaGLIPtin-metformin (JANUMET) 50-1000 MG per tablet Take 1 tablet by mouth daily.    Historical Provider, MD    Physical Exam: Filed Vitals:   10/24/15 2114 10/24/15 2200 10/24/15 2338 10/25/15 0200  BP: 146/96 139/94 175/106 145/90  Pulse: 72  74 75  Temp:  97.6 F (36.4 C)  98 F (36.7 C)   TempSrc: Oral  Oral   Resp: Height:    (1.854 m)   Weight:   166.561 kg (367 lb 3.2 oz)   SpO2: 96%  97% 88%   General: Not in acute distress HEENT:       Eyes: PERRL, EOMI, no scleral icterus.       ENT: No discharge from the ears and nose, no pharynx injection, no tonsillar enlargement.        Neck: Difficult to assess JVD due to morbid obesity, no bruit, no mass felt. Heme: No neck lymph node enlargement. Cardiac: S1/S2, RRR, No murmurs, No gallops or rubs. Pulm: Has fine crackles bilaterally. No wheezing, rhonchi or rubs. Abd: Soft, nondistended, nontender, no rebound pain, no organomegaly, BS present. Ext: 1+ pitting leg edema bilaterally. 2+DP/PT pulse bilaterally. Musculoskeletal: No joint deformities, No joint redness or warmth, no limitation of ROM in spin. Skin: No rashes.  Neuro: Alert, oriented X3, cranial nerves II-XII grossly intact, moves all extremities normally. Psych: Patient is not psychotic, no suicidal or hemocidal ideation.  Labs on Admission:  Basic Metabolic Panel:  Recent Labs Lab 10/24/15 2013  NA 136  K 3.8  CL 100*  CO2 26  GLUCOSE 267*  BUN 24*  CREATININE 1.30*  CALCIUM 9.2   Liver Function Tests: No results for input(s): AST, ALT, ALKPHOS, BILITOT, PROT, ALBUMIN in the last 168 hours. No results for input(s): LIPASE, AMYLASE in the last 168 hours. No results for input(s): AMMONIA in the last 168 hours. CBC:  Recent Labs Lab 10/24/15 2013  WBC 10.8*  HGB 14.7  HCT 44.0  MCV 86.3  PLT 208   Cardiac Enzymes:  Recent Labs Lab 10/24/15 2013 10/25/15 0012  TROPONINI 0.20* 0.13*    BNP (last 3 results)  Recent Labs  10/24/15 2013  BNP 245.9*    ProBNP (last 3 results) No results for input(s): PROBNP in the last 8760 hours.  CBG:  Recent Labs Lab 10/25/15 0027  GLUCAP 262*    Radiological Exams on Admission: Dg Chest 2 View  10/24/2015  CLINICAL DATA:   45 year old male with shortness of breath EXAM: CHEST  2 VIEW COMPARISON:  Radiograph dated 04/03/2014 FINDINGS: Two views of the chest do not demonstrate a focal consolidation. There are blunting of the costophrenic angles bilaterally which may represent small pleural effusion. No pneumothorax. There is stable cardiomegaly. No acute osseous pathology. IMPRESSION: Small bilateral pleural effusions. No focal consolidation or pneumothorax. Electronically Signed   By: Elgie Collard M.D.   On: 10/24/2015 20:25    Assessment/Plan Principal Problem:   Acute respiratory failure (HCC) Active Problems:   Shortness of breath   CKD (chronic kidney disease), stage II   Hypertension   Diabetes mellitus without complication (HCC)   Coronary artery disease   CHF (congestive heart failure) (HCC)   Obesity   SOB (shortness of breath)   Acute on chronic congestive heart failure (HCC)   Elevated troponin  Acute respiratory failure: this is likely due to CHF  exacerbation given his orthopnea, bilateral leg edema and elevated BNP. Another potential differential diagnosis is bronchitis given productive cough and mild leukocytosis. Cardiology was consulted, Dr. Tresa Endo evaluated the patient.  -will admit to tele bed -Appreciate Dr. Landry Dyke consultation, will follow up recommendations as follows: Marland Kitchen Trial of IV diuresis-->IV Lasix 40 mg daily . Observation on telemetry, trend cardiac biomarkers, if troponins rise sharply then start heparin gtt . Echocardiogram in AM . Improve blood pressure control. -will start Z pak empirically for possible bronchitis and get sputum culture -When necessary albuterol nebulizers for shortness of breath -Mucin neck supple cough  HTN: Blood pressure is elevated at 161/114, most likely due to medication noncompliance. -Lisinopril, metoprolol, amlodipin -IV hydralazine when necessary -IV Lasix  DM-II: No A1c on record. Patient was on Janumet at home. Blood sugar is 267 on  admission. -SSI -check A1c  AoKCD-II: Stable. Baseline creatinine 1.3 on 04/03/14. His creatinine is 1.30, BUN 24 on admission. -Follow-up by BMP  CAD and elevated trop: trop 0.20-->0.13. most likely due to demanding ischemia 2/2 possible CHF exacerbation. No chest pain currently. - cycle CE q6 x3 and repeat her EKG in the am - prn Nitroglycerin and Morphine, Aspirin and lipitor - Risk factor stratification: will check FLP, UDS and A1C   - 2d echo  DVT ppx:   SQ Lovenox  Code Status: Full code Family Communication: None at bed side.  Disposition Plan: Admit to inpatient   Date of Service 10/25/2015    Brett Flores [facility] Pager [phone]  If 7PM-7AM, please contact night-coverage [email or web address] Password [email or web address] 10/25/2015, 3:01 AM

## 2015-10-25 NOTE — Progress Notes (Signed)
UR Completed Vann Okerlund Graves-Bigelow, RN,BSN [phone]  

## 2015-10-25 NOTE — Progress Notes (Signed)
Inpatient Diabetes Program Recommendations  AACE/ADA: New Consensus Statement on Inpatient Glycemic Control (2015)  Target Ranges:  Prepandial:   less than 140 mg/dL      Peak postprandial:   less than 180 mg/dL (1-2 hours)      Critically ill patients:  140 - 180 mg/dL   Review of Glycemic Control  Results for MONTERRIO, GERST (MRN [id number]) as of 10/25/2015 16:11  Ref. Range 10/25/2015 00:27 10/25/2015 07:29 10/25/2015 11:16  Glucose-Capillary Latest Ref Range: 65-99 mg/dL 956 (H) 213 (H) 086 (H)   Awaiting HgbA1C results.  Inpatient Diabetes Program Recommendations:     Increase Novolog to moderate tidwc and hs. If FBS > 180 mg/dL, consider starting Levemir 20 units QHS. Needs OP Diabetes Education consult for weight loss and glycemic control.  Will follow. Thank you. Ailene Ards, RD, LDN, CDE Inpatient Diabetes Coordinator [phone]

## 2015-10-25 NOTE — Progress Notes (Addendum)
    Subjective:  Feels much better this morning. States breathing is markedly improved. Felt almost immediately better after administration of IV diuretics yesterday. He denies chest pain this morning.  Objective:  Vital Signs in the last 24 hours: Temp:  [97.6 F (36.4 C)-98.4 F (36.9 C)] 98.4 F (36.9 C) (02/08 0358) Pulse Rate:  [64-75] 64 (02/08 1032) Resp:  [17-27] 19 (02/08 0358) BP: (139-175)/(90-114) 165/112 mmHg (02/08 1030) SpO2:  [88 %-97 %] 97 % (02/08 0358) Weight:  [159.666 kg (352 lb)-166.561 kg (367 lb 3.2 oz)] 165.155 kg (364 lb 1.6 oz) (02/08 0358)  Intake/Output from previous day: 02/07 0701 - 02/08 0700 In: 120 [P.O.:120] Out: 1350 [Urine:1350]  Physical Exam: Pt is alert and oriented, Pleasant morbidly obese male in NAD HEENT: normal Neck: JVP - normal Lungs: CTA bilaterally CV: RRR without murmur or gallop Abd: soft, NT, Positive BS, no hepatomegaly Ext: Trace edema bilaterally, distal pulses intact and equal Skin: warm/dry no rash   Lab Results:  Recent Labs  10/24/15 2013  WBC 10.8*  HGB 14.7  PLT 208    Recent Labs  10/24/15 2013  NA 136  K 3.8  CL 100*  CO2 26  GLUCOSE 267*  BUN 24*  CREATININE 1.30*    Recent Labs  10/25/15 0012 10/25/15 0601  TROPONINI 0.13* 0.10*    Cardiac Studies: 2-D echocardiogram is currently pending  Assessment/Plan:  1. Acute congestive heart failure, likely diastolic: Await 2-D echocardiogram. In discussion with the patient, he admits to dietary noncompliance, noncompliance with his antihypertensive medicines, and some depression. He understands the need for lifestyle modification with medication adherence and weight loss. Will continue diuresis today, await 2-D echo result, and would anticipate hospital discharge tomorrow as long as he remains stable.  2. Elevated troponin, suspect mild troponin elevation with flat trend is related to acute heart failure. The patient has no ischemic symptoms.  He does have some nonspecific changes on his EKG, but risk of obstructive CAD is relatively low. Can consider outpatient stress testing although his body habitus makes this less than optimal. I do not think invasive cardiac evaluation is indicated at this time. Will follow.  3. Hypertension with heart failure: Blood pressure is uncontrolled.He's been placed on lisinopril, amlodipine, and metoprolol. Add hydralazine if BP remains elevated. Will follow.  Disposition: Follow blood pressure, await 2-D echocardiogram, continue IV diuresis, anticipate hospital discharge tomorrow unless there is significant LV dysfunction which would warrant cardiac catheterization.  Tonny Bollman, M.D. 10/25/2015, 10:51 AM

## 2015-10-25 NOTE — Progress Notes (Signed)
PATIENT DETAILS Name: Brett Flores Age: 45 y.o. Sex: male Date of Birth: [birth year] Admit Date: 10/24/2015 Admitting Physician Bobette Mo, MD PCP:No PCP Per Patient  Subjective: Feels much better than on admission-almost back to his usual baseline.  Assessment/Plan: Principal Problem:  Acute respiratory failure: Suspect secondary to acute diastolic heart failure. Back on room air following IV Lasix. Doubt acute bronchitis as rapidly improved with diuretics, will discontinue antibiotics.  Active Problems: Presumed acute diastolic heart failure: Likely causing above, continue IV Lasix. Await echocardiogram.-1.2L so far  Minimally elevated Troponins:secondary to acute CHF-trend is flat-not consistent with ACS. Await Echo-if no wall motion abnormality-no further inpatient recommendation from cardiology  Hypertension: Uncontrolled, continue amlodipine, lisinopril and metoprolol. Follow and adjust accordingly.  Type 2 diabetes: Await A1c, continue SSI for now. Continue to hold oral hypoglycemic agents and Exenatide  CKD stage II: Creatinine close to usual baseline, follow lytes closely while on Lasix.  Morbid obesity: Counseled regarding importance of weight loss  Disposition: Remain inpatient-suspect home tomorrow  Antimicrobial agents  See below  Anti-infectives    Start     Dose/Rate Route Frequency Ordered Stop   10/26/15 1000  azithromycin (ZITHROMAX) tablet 250 mg     250 mg Oral Daily 10/25/15 0251 10/30/15 0959   10/25/15 1000  azithromycin (ZITHROMAX) tablet 500 mg     500 mg Oral Daily 10/25/15 0251 10/25/15 1031      DVT Prophylaxis: Prophylactic Lovenox   Code Status: Full code   Family Communication None at bedside  Procedures: None  CONSULTS:  cardiology  Time spent 30 minutes-Greater than 50% of this time was spent in counseling, explanation of diagnosis, planning of further management, and coordination of  care.  MEDICATIONS: Scheduled Meds: . amLODipine  10 mg Oral Daily  . aspirin EC  325 mg Oral Daily  . atorvastatin  20 mg Oral Daily  . [START ON 10/26/2015] azithromycin  250 mg Oral Daily  . dextromethorphan-guaiFENesin  1 tablet Oral BID  . enoxaparin (LOVENOX) injection  80 mg Subcutaneous Q24H  . furosemide  40 mg Intravenous Daily  . insulin aspart  0-5 Units Subcutaneous QHS  . insulin aspart  0-9 Units Subcutaneous TID WC  . lisinopril  20 mg Oral Daily  . metoprolol  100 mg Oral BID  . sodium chloride flush  3 mL Intravenous Q12H   Continuous Infusions:  PRN Meds:.sodium chloride, acetaminophen, albuterol, hydrALAZINE, morphine injection, nitroGLYCERIN, sodium chloride flush    PHYSICAL EXAM: Vital signs in last 24 hours: Filed Vitals:   10/25/15 0200 10/25/15 0358 10/25/15 1030 10/25/15 1032  BP: 145/90 140/92 165/112   Pulse: 75 64  64  Temp:  98.4 F (36.9 C)    TempSrc:  Oral    Resp:  19    Height:      Weight:  165.155 kg (364 lb 1.6 oz)    SpO2: 88% 97%      Weight change:  Filed Weights   10/24/15 1729 10/24/15 2338 10/25/15 0358  Weight: 159.666 kg (352 lb) 166.561 kg (367 lb 3.2 oz) 165.155 kg (364 lb 1.6 oz)   Body mass index is 48.05 kg/(m^2).   Gen Exam: Awake and alert with clear speech.   Neck: Supple, No JVD.   Chest: B/L Clear.   CVS: S1 S2 Regular, no murmurs.  Abdomen: soft, BS +, non tender, non distended.  Extremities: + edema, lower extremities  warm to touch. Neurologic: Non Focal.   Skin: No Rash.   Wounds: N/A.   Intake/Output from previous day:  Intake/Output Summary (Last 24 hours) at 10/25/15 1121 Last data filed at 10/25/15 0800  Gross per 24 hour  Intake    120 ml  Output   1350 ml  Net  -1230 ml     LAB RESULTS: CBC  Recent Labs Lab 10/24/15 2013  WBC 10.8*  HGB 14.7  HCT 44.0  PLT 208  MCV 86.3  MCH 28.8  MCHC 33.4  RDW 12.4    Chemistries   Recent Labs Lab 10/24/15 2013  NA 136  K 3.8  CL  100*  CO2 26  GLUCOSE 267*  BUN 24*  CREATININE 1.30*  CALCIUM 9.2    CBG:  Recent Labs Lab 10/25/15 0027 10/25/15 0729  GLUCAP 262* 213*    GFR Estimated Creatinine Clearance: 118.1 mL/min (by C-G formula based on Cr of 1.3).  Coagulation profile  Recent Labs Lab 10/25/15 0012  INR 1.08    Cardiac Enzymes  Recent Labs Lab 10/24/15 2013 10/25/15 0012 10/25/15 0601  TROPONINI 0.20* 0.13* 0.10*    Invalid input(s): POCBNP No results for input(s): DDIMER in the last 72 hours. No results for input(s): HGBA1C in the last 72 hours.  Recent Labs  10/25/15 0035  CHOL 200  HDL 41  LDLCALC 106*  TRIG 263*  CHOLHDL 4.9    Recent Labs  10/25/15 0012  TSH 5.188*   No results for input(s): VITAMINB12, FOLATE, FERRITIN, TIBC, IRON, RETICCTPCT in the last 72 hours. No results for input(s): LIPASE, AMYLASE in the last 72 hours.  Urine Studies No results for input(s): UHGB, CRYS in the last 72 hours.  Invalid input(s): UACOL, UAPR, USPG, UPH, UTP, UGL, UKET, UBIL, UNIT, UROB, ULEU, UEPI, UWBC, URBC, UBAC, CAST, UCOM, BILUA  MICROBIOLOGY: No results found for this or any previous visit (from the past 240 hour(s)).  RADIOLOGY STUDIES/RESULTS: Dg Chest 2 View  10/24/2015  CLINICAL DATA:  45 year old male with shortness of breath EXAM: CHEST  2 VIEW COMPARISON:  Radiograph dated 04/03/2014 FINDINGS: Two views of the chest do not demonstrate a focal consolidation. There are blunting of the costophrenic angles bilaterally which may represent small pleural effusion. No pneumothorax. There is stable cardiomegaly. No acute osseous pathology. IMPRESSION: Small bilateral pleural effusions. No focal consolidation or pneumothorax. Electronically Signed   By: Elgie Collard M.D.   On: 10/24/2015 20:25    Jeoffrey Massed, MD  [facility] Pager:[phone]  If 7PM-7AM, please contact night-coverage [email or web address] Password [email or web address] 10/25/2015, 11:21 AM   LOS: 1 day

## 2015-10-25 NOTE — Progress Notes (Signed)
Patient's o2 sats running 88-90% on r/a. Oxygen at 2l/[place] applied and sats up to 94%, pt states he is on cpap at night at home and if he does not discharge today would like to have one ordered. Will continue to monitor.[facility] [employer]

## 2015-10-26 ENCOUNTER — Inpatient Hospital Stay (HOSPITAL_COMMUNITY): Payer: Medicaid Other

## 2015-10-26 DIAGNOSIS — R7989 Other specified abnormal findings of blood chemistry: Secondary | ICD-10-CM

## 2015-10-26 DIAGNOSIS — J9601 Acute respiratory failure with hypoxia: Secondary | ICD-10-CM

## 2015-10-26 DIAGNOSIS — N182 Chronic kidney disease, stage 2 (mild): Secondary | ICD-10-CM

## 2015-10-26 LAB — BASIC METABOLIC PANEL
ANION GAP: 10 (ref 5–15)
BUN: 23 mg/dL — ABNORMAL HIGH (ref 6–20)
CO2: 27 mmol/L (ref 22–32)
Calcium: 9.1 mg/dL (ref 8.9–10.3)
Chloride: 102 mmol/L (ref 101–111)
Creatinine, Ser: 1.19 mg/dL (ref 0.61–1.24)
GFR calc non Af Amer: >60 mL/min (ref >60)
GLUCOSE: 245 mg/dL — AB (ref 65–99)
POTASSIUM: 3.5 mmol/L (ref 3.5–5.1)
Sodium: 139 mmol/L (ref 135–145)

## 2015-10-26 LAB — HEMOGLOBIN A1C
Hgb A1c MFr Bld: 10.5 % — ABNORMAL HIGH (ref 4.8–5.6)
MEAN PLASMA GLUCOSE: 255 mg/dL

## 2015-10-26 LAB — GLUCOSE, CAPILLARY
GLUCOSE-CAPILLARY: 251 mg/dL — AB (ref 65–99)
Glucose-Capillary: 207 mg/dL — ABNORMAL HIGH (ref 65–99)
Glucose-Capillary: 183 mg/dL — ABNORMAL HIGH (ref 65–99)

## 2015-10-26 MED ORDER — POTASSIUM CHLORIDE CRYS ER 20 MEQ PO TBCR: 20.0000 meq | EXTENDED_RELEASE_TABLET | Freq: Every day | ORAL | Status: DC

## 2015-10-26 MED ORDER — CLONIDINE HCL 0.1 MG PO TABS: 0.1000 mg | ORAL_TABLET | Freq: Two times a day (BID) | ORAL | Status: DC

## 2015-10-26 MED ORDER — LISINOPRIL 40 MG PO TABS: 40.0000 mg | ORAL_TABLET | Freq: Every day | ORAL | Status: DC

## 2015-10-26 MED ORDER — FUROSEMIDE 40 MG PO TABS
40.0000 mg | ORAL_TABLET | Freq: Every day | ORAL | Status: DC
  Administered 2015-10-26: 40 mg via ORAL
  Filled 2015-10-26: qty 1

## 2015-10-26 MED ORDER — AMLODIPINE BESYLATE 10 MG PO TABS: 10.0000 mg | ORAL_TABLET | Freq: Every day | ORAL | Status: AC

## 2015-10-26 MED ORDER — METOPROLOL TARTRATE 100 MG PO TABS: 100.0000 mg | ORAL_TABLET | Freq: Two times a day (BID) | ORAL | Status: DC

## 2015-10-26 MED ORDER — ASPIRIN 81 MG PO TBEC
81.0000 mg | DELAYED_RELEASE_TABLET | Freq: Every day | ORAL | Status: AC
Start: 2015-10-26 — End: ?

## 2015-10-26 MED ORDER — CLONIDINE HCL 0.2 MG PO TABS
0.2000 mg | ORAL_TABLET | Freq: Two times a day (BID) | ORAL | Status: DC
  Administered 2015-10-26: 0.2 mg via ORAL
  Filled 2015-10-26: qty 1

## 2015-10-26 MED ORDER — ATORVASTATIN CALCIUM 20 MG PO TABS: 20.0000 mg | ORAL_TABLET | Freq: Every day | ORAL | Status: DC

## 2015-10-26 MED ORDER — FUROSEMIDE 40 MG PO TABS: 40.0000 mg | ORAL_TABLET | Freq: Every day | ORAL | Status: DC

## 2015-10-26 MED ORDER — SITAGLIPTIN-METFORMIN HCL 50-1000 MG PO TABS
1.0000 | ORAL_TABLET | Freq: Every day | ORAL | Status: AC
Start: 2015-10-26 — End: ?

## 2015-10-26 NOTE — Plan of Care (Signed)
Problem: Cardiac: Goal: Ability to achieve and maintain adequate cardiopulmonary perfusion will improve Outcome: Completed/Met Date Met:  10/26/15 Pt able to ambulate with no distress and maintains adequate cardiopulmonary perfusion.  Lowell Guitar

## 2015-10-26 NOTE — Discharge Summary (Signed)
PATIENT DETAILS Name: Brett Flores Age: 45 y.o. Sex: male Date of Birth: 12-16-1970 MRN: [id number]. Admitting Physician: Bobette Mo, MD PCP:No PCP Per Patient  Admit Date: 10/24/2015 Discharge date: 10/26/2015  Recommendations for Outpatient Follow-up:  1. Optimize antihypertensives 2. Ensure follow up with cardiology 3. Consider outpatient sleep study 4. Please repeat CBC/BMET at next visit   PRIMARY DISCHARGE DIAGNOSIS:  Principal Problem:   Acute respiratory failure (HCC) Active Problems:   Shortness of breath   CKD (chronic kidney disease), stage II   Hypertension   Diabetes mellitus without complication (HCC)   Coronary artery disease   CHF (congestive heart failure) (HCC)   Obesity   SOB (shortness of breath)   Acute on chronic congestive heart failure (HCC)   Elevated troponin   Acute diastolic heart failure (HCC)      PAST MEDICAL HISTORY: Past Medical History  Diagnosis Date  . Hypertension   . Diabetes mellitus without complication (HCC)   . Coronary artery disease   . CHF (congestive heart failure) (HCC)   . Obesity   . CKD (chronic kidney disease), stage II     DISCHARGE MEDICATIONS: Current Discharge Medication List    START taking these medications   Details  aspirin EC 81 MG EC tablet Take 1 tablet (81 mg total) by mouth daily. Qty: 30 tablet, Refills: 0      CONTINUE these medications which have CHANGED   Details  amLODipine (NORVASC) 10 MG tablet Take 1 tablet (10 mg total) by mouth daily. Qty: 30 tablet, Refills: 0    atorvastatin (LIPITOR) 20 MG tablet Take 1 tablet (20 mg total) by mouth daily. Reported on 10/25/2015 Qty: 30 tablet, Refills: 0    cloNIDine (CATAPRES) 0.1 MG tablet Take 1 tablet (0.1 mg total) by mouth 2 (two) times daily. Qty: 60 tablet, Refills: 0    furosemide (LASIX) 40 MG tablet Take 1 tablet (40 mg total) by mouth daily. Qty: 30 tablet, Refills: 0    lisinopril (PRINIVIL,ZESTRIL) 40 MG tablet Take 1  tablet (40 mg total) by mouth daily. Qty: 30 tablet, Refills: 0    metoprolol (LOPRESSOR) 100 MG tablet Take 1 tablet (100 mg total) by mouth 2 (two) times daily. Qty: 60 tablet, Refills: 0    potassium chloride SA (K-DUR,KLOR-CON) 20 MEQ tablet Take 1 tablet (20 mEq total) by mouth daily. Qty: 30 tablet, Refills: 0    sitaGLIPtin-metformin (JANUMET) 50-1000 MG tablet Take 1 tablet by mouth daily. Qty: 30 tablet, Refills: 0      CONTINUE these medications which have NOT CHANGED   Details  Exenatide ER (BYDUREON) 2 MG PEN Inject 2 mg into the skin once a week.      STOP taking these medications     hydrochlorothiazide (HYDRODIURIL) 25 MG tablet         ALLERGIES:  No Known Allergies  BRIEF HPI:  See H&P, Labs, Consult and Test reports for all details in brief, patient was admitted for evaluation of sob.  CONSULTATIONS:   cardiology  PERTINENT RADIOLOGIC STUDIES: Dg Chest 2 View  10/24/2015  CLINICAL DATA:  45 year old male with shortness of breath EXAM: CHEST  2 VIEW COMPARISON:  Radiograph dated 04/03/2014 FINDINGS: Two views of the chest do not demonstrate a focal consolidation. There are blunting of the costophrenic angles bilaterally which may represent small pleural effusion. No pneumothorax. There is stable cardiomegaly. No acute osseous pathology. IMPRESSION: Small bilateral pleural effusions. No focal consolidation or pneumothorax. Electronically Signed  By: Elgie Collard M.D.   On: 10/24/2015 20:25     PERTINENT LAB RESULTS: CBC:  Recent Labs  10/24/15 2013  WBC 10.8*  HGB 14.7  HCT 44.0  PLT 208   CMET CMP     Component Value Date/Time   NA 139 10/26/2015 0632   K 3.5 10/26/2015 0632   CL 102 10/26/2015 0632   CO2 27 10/26/2015 0632   GLUCOSE 245* 10/26/2015 0632   BUN 23* 10/26/2015 0632   CREATININE 1.19 10/26/2015 0632   CALCIUM 9.1 10/26/2015 0632   PROT 7.3 04/03/2014 1550   ALBUMIN 3.6 04/03/2014 1550   AST 29 04/03/2014 1550   ALT  44 04/03/2014 1550   ALKPHOS 98 04/03/2014 1550   BILITOT 0.7 04/03/2014 1550   GFRNONAA >60 10/26/2015 0632   GFRAA >60 10/26/2015 0632    GFR Estimated Creatinine Clearance: 129.2 mL/min (by C-G formula based on Cr of 1.19). No results for input(s): LIPASE, AMYLASE in the last 72 hours.  Recent Labs  10/25/15 0012 10/25/15 0601 10/25/15 1125  TROPONINI 0.13* 0.10* 0.10*   Invalid input(s): POCBNP No results for input(s): DDIMER in the last 72 hours.  Recent Labs  10/25/15 0034  HGBA1C 10.5*    Recent Labs  10/25/15 0035  CHOL 200  HDL 41  LDLCALC 106*  TRIG 263*  CHOLHDL 4.9    Recent Labs  10/25/15 0012  TSH 5.188*   No results for input(s): VITAMINB12, FOLATE, FERRITIN, TIBC, IRON, RETICCTPCT in the last 72 hours. Coags:  Recent Labs  10/25/15 0012  INR 1.08   Microbiology: No results found for this or any previous visit (from the past 240 hour(s)).   BRIEF HOSPITAL COURSE:  Acute respiratory failure: Suspect secondary to acute diastolic heart failure. Rapidly improved with IV Lasix. Back on room air.   Active Problems: Acute diastolic heart failure:Rapidly improved with IV Lasix, now compensated, stable for discharge. Neg balance of 3.1L.Echo shows preserved EF, but does shows diastolic dysfunction. Will transition to oral Lasix, stable for discharge with outpatient cardiology follow up.    Minimally elevated Troponins:secondary to acute CHF-trend is flat-not consistent with ACS. Await Echo-if no wall motion abnormality-no further inpatient recommendation from cardiology  Hypertension: Uncontrolled, continue amlodipine, lisinopril and metoprolol. Will restart Clonidine on discharge. Follow and adjust accordingly.  Type 2 diabetes:  A1c 10.5, CBG's relatively stable with SSI while inpatient. Resume oral hypoglycemic agents and Exenatide on discharge.   CKD stage II: Creatinine close to usual baseline, follow lytes closely while on  Lasix.  Morbid obesity: Counseled regarding importance of weight loss. Defer to outpatient setting for bariatric surgery referral  TODAY-DAY OF DISCHARGE:  Subjective:   Brett Flores today has no headache,no chest abdominal pain,no new weakness tingling or numbness, feels much better wants to go home today.   Objective:   Blood pressure 133/84, pulse 64, temperature 97.5 F (36.4 C), temperature source Oral, resp. rate 17, height 6\' 1"  (1.854 m), weight 165.427 kg (364 lb 11.2 oz), SpO2 97 %.  Intake/Output Summary (Last 24 hours) at 10/26/15 1357 Last data filed at 10/26/15 1344  Gross per 24 hour  Intake    720 ml  Output   1775 ml  Net  -1055 ml   Filed Weights   10/24/15 2338 10/25/15 0358 10/26/15 0530  Weight: 166.561 kg (367 lb 3.2 oz) 165.155 kg (364 lb 1.6 oz) 165.427 kg (364 lb 11.2 oz)    Exam Awake Alert, Oriented *3, No new  F.N deficits, Normal affect [place].AT,PERRAL Supple Neck,No JVD, No cervical lymphadenopathy appriciated.  Symmetrical Chest wall movement, Good air movement bilaterally, CTAB RRR,No Gallops,Rubs or new Murmurs, No Parasternal Heave +ve B.Sounds, Abd Soft, Non tender, No organomegaly appriciated, No rebound -guarding or rigidity. No Cyanosis, Clubbing or edema, No new Rash or bruise  DISCHARGE CONDITION: Stable  DISPOSITION: Home  DISCHARGE INSTRUCTIONS:    Activity:  As tolerated   Get Medicines reviewed and adjusted: Please take all your medications with you for your next visit with your Primary MD  Please request your Primary MD to go over all hospital tests and procedure/radiological results at the follow up, please ask your Primary MD to get all Hospital records sent to his/her office.  If you experience worsening of your admission symptoms, develop shortness of breath, life threatening emergency, suicidal or homicidal thoughts you must seek medical attention immediately by calling 911 or calling your MD immediately  if symptoms less  severe.  You must read complete instructions/literature along with all the possible adverse reactions/side effects for all the Medicines you take and that have been prescribed to you. Take any new Medicines after you have completely understood and accpet all the possible adverse reactions/side effects.   Do not drive when taking Pain medications.   Do not take more than prescribed Pain, Sleep and Anxiety Medications  Special Instructions: If you have smoked or chewed Tobacco  in the last 2 yrs please stop smoking, stop any regular Alcohol  and or any Recreational drug use.  Wear Seat belts while driving.  Please note  You were cared for by a hospitalist during your hospital stay. Once you are discharged, your primary care physician will handle any further medical issues. Please note that NO REFILLS for any discharge medications will be authorized once you are discharged, as it is imperative that you return to your primary care physician (or establish a relationship with a primary care physician if you do not have one) for your aftercare needs so that they can reassess your need for medications and monitor your lab values.   Diet recommendation: Diabetic Diet Heart Healthy diet  Discharge Instructions    (HEART FAILURE PATIENTS) Call MD:  Anytime you have any of the following symptoms: 1) 3 pound weight gain in 24 hours or 5 pounds in 1 week 2) shortness of breath, with or without a dry hacking cough 3) swelling in the hands, feet or stomach 4) if you have to sleep on extra pillows at night in order to breathe.    Complete by:  As directed      Diet - low sodium heart healthy    Complete by:  As directed      Diet Carb Modified    Complete by:  As directed      Increase activity slowly    Complete by:  As directed            Follow-up Information    Follow up with [facility]. Go on 11/02/2015.   Why:  , Hospital follow up @ 830 @ [address] location   Contact  information:   [address] [place] [address] [phone]       Follow up with Tonny Bollman, MD. Go on 10/31/2015.   Specialty:  Cardiology   Why:  Hospital follow up @ 1030 am   Contact information:   [address] [place] [address] [phone]      Total  Time spent on discharge equals 45 minutes.  SignedJeoffrey Massed 10/26/2015 1:57 PM

## 2015-10-26 NOTE — Progress Notes (Signed)
Patient Name: Brett Flores Date of Encounter: 10/26/2015   SUBJECTIVE  No chest pain or sob. Ambulated well yesterday with PT.   CURRENT MEDS . amLODipine  10 mg Oral Daily  . aspirin EC  325 mg Oral Daily  . atorvastatin  20 mg Oral Daily  . dextromethorphan-guaiFENesin  1 tablet Oral BID  . enoxaparin (LOVENOX) injection  80 mg Subcutaneous Q24H  . furosemide  40 mg Intravenous Daily  . insulin aspart  0-5 Units Subcutaneous QHS  . insulin aspart  0-9 Units Subcutaneous TID WC  . lisinopril  20 mg Oral Daily  . metoprolol  100 mg Oral BID  . sodium chloride flush  3 mL Intravenous Q12H    OBJECTIVE  Filed Vitals:   10/25/15 1032 10/25/15 1340 10/25/15 2114 10/26/15 0530  BP:  140/88 159/99 130/91  Pulse: 64 70 66 59  Temp:  98 F (36.7 C) 97.7 F (36.5 C) 98.6 F (37 C)  TempSrc:  Oral Oral Oral  Resp:  Height:      Weight:    364 lb 11.2 oz (165.427 kg)  SpO2:  94% 98% 93%    Intake/Output Summary (Last 24 hours) at 10/26/15 0845 Last data filed at 10/26/15 0841  Gross per 24 hour  Intake    480 ml  Output   2275 ml  Net  -1795 ml   Filed Weights   10/24/15 2338 10/25/15 0358 10/26/15 0530  Weight: 367 lb 3.2 oz (166.561 kg) 364 lb 1.6 oz (165.155 kg) 364 lb 11.2 oz (165.427 kg)    PHYSICAL EXAM  General: Pleasant, morbidly obese male in NAD. Neuro: Alert and oriented X 3. Moves all extremities spontaneously. Psych: Normal affect. HEENT:  Normal  Neck: Supple without bruits or JVD. Lungs:  Resp regular and unlabored, CTA. Heart: RRR no s3, s4, or murmurs. Abdomen: Soft, non-tender, non-distended, BS + x 4.  Extremities: No clubbing, cyanosis. Trace BL Le edema. DP/PT/Radials 2+ and equal bilaterally.  Accessory Clinical Findings  CBC  Recent Labs  10/24/15 2013  WBC 10.8*  HGB 14.7  HCT 44.0  MCV 86.3  PLT 208   Basic Metabolic Panel  Recent Labs  10/24/15 2013 10/26/15 0632  NA 136 139  K 3.8 3.5  CL 100* 102  CO2 26  27  GLUCOSE 267* 245*  BUN 24* 23*  CREATININE 1.30* 1.19  CALCIUM 9.2 9.1   Liver Function Tests No results for input(s): AST, ALT, ALKPHOS, BILITOT, PROT, ALBUMIN in the last 72 hours. No results for input(s): LIPASE, AMYLASE in the last 72 hours. Cardiac Enzymes  Recent Labs  10/25/15 0012 10/25/15 0601 10/25/15 1125  TROPONINI 0.13* 0.10* 0.10*   BNP Invalid input(s): POCBNP D-Dimer No results for input(s): DDIMER in the last 72 hours. Hemoglobin A1C  Recent Labs  10/25/15 0034  HGBA1C 10.5*   Fasting Lipid Panel  Recent Labs  10/25/15 0035  CHOL 200  HDL 41  LDLCALC 106*  TRIG 263*  CHOLHDL 4.9   Thyroid Function Tests  Recent Labs  10/25/15 0012  TSH 5.188*    TELE  Sinus rhythm   Radiology/Studies  Dg Chest 2 View  10/24/2015  CLINICAL DATA:  45 year old male with shortness of breath EXAM: CHEST  2 VIEW COMPARISON:  Radiograph dated 04/03/2014 FINDINGS: Two views of the chest do not demonstrate a focal consolidation. There are blunting of the costophrenic angles bilaterally which may represent small pleural effusion. No pneumothorax. There  is stable cardiomegaly. No acute osseous pathology. IMPRESSION: Small bilateral pleural effusions. No focal consolidation or pneumothorax. Electronically Signed   By: Elgie Collard M.D.   On: 10/24/2015 20:25    ASSESSMENT AND PLAN  1. Acute congestive heart failure, likely diastolic:  - Dyspnea resolved with IV diuresis. I&O negative 3L. Weight down 3lb (367-->364). Lungs clear. Trace BL LE edema.  -Will switch IV to po lasix  this morning. Apply compression stocking. Scr stable.  - Pending echo, spoken with echo tech, will do sooner.  Advice lifestyle modifications. Further recommendation pending echo.   2. Flat Elevated troponin: - Likely  related to acute heart failure. No anginal pain. Nonspecific changes on his EKG in lateral leads, but risk of obstructive CAD is relatively low. Cnsider outpatient  stress testing although his body habitus makes this less than optimal.   3. Hypertension with heart failure:  - Blood pressure is improved.  - Continue  lisinopril, amlodipine, and metoprolol. Add hydralazine if BP remains elevated.  4. Obesity - Advised life style modifications.    [name] PA-C Pager [phone]  Patient seen, examined. Available data reviewed. Agree with findings, assessment, and plan as outlined by Chelsea Aus, PA-C. The patient is clinically improved. Heart is RRR, lungs clear, trace edema. BP remains uncontrolled. Probably need to put him back on clonidine. Otherwise would continue lisinopril, lasix, metoprolol, and amlodipine at current doses. Suspect diastolic CHF but await echo interpretation. Once echo is read, he is ok for discharge. Will arrange cardiology follow-up for diastolic heart failure.   Tonny Bollman, M.D. 10/26/2015 11:34 AM

## 2015-10-26 NOTE — Progress Notes (Signed)
  Echocardiogram 2D Echocardiogram has been performed.  Brett Flores 10/26/2015, 10:26 AM

## 2015-10-30 ENCOUNTER — Telehealth: Payer: Self-pay | Admitting: Physician Assistant

## 2015-10-30 NOTE — Progress Notes (Signed)
Cardiology Office Note:    Date:  10/30/2015   ID:  Brett Flores, DOB Jan 22, 1971, MRN [id number]  PCP:  No PCP Per Patient  Cardiologist:  Dr. Tonny Bollman   Electrophysiologist:  n/a  Chief Complaint  Patient presents with  . Hospitalization Follow-up    Admitted with Acute CHF     History of Present Illness:     Brett Flores is a 45 y.o. male with a hx of     Past Medical History  Diagnosis Date  . Hypertension   . Diabetes mellitus without complication (HCC)   . Coronary artery disease   . CHF (congestive heart failure) (HCC)   . Obesity   . CKD (chronic kidney disease), stage II     No past surgical history on file.  Current Medications: Outpatient Prescriptions Prior to Visit  Medication Sig Dispense Refill  . amLODipine (NORVASC) 10 MG tablet Take 1 tablet (10 mg total) by mouth daily. 30 tablet 0  . aspirin EC 81 MG EC tablet Take 1 tablet (81 mg total) by mouth daily. 30 tablet 0  . atorvastatin (LIPITOR) 20 MG tablet Take 1 tablet (20 mg total) by mouth daily. Reported on 10/25/2015 30 tablet 0  . cloNIDine (CATAPRES) 0.1 MG tablet Take 1 tablet (0.1 mg total) by mouth 2 (two) times daily. 60 tablet 0  . Exenatide ER (BYDUREON) 2 MG PEN Inject 2 mg into the skin once a week.    . furosemide (LASIX) 40 MG tablet Take 1 tablet (40 mg total) by mouth daily. 30 tablet 0  . lisinopril (PRINIVIL,ZESTRIL) 40 MG tablet Take 1 tablet (40 mg total) by mouth daily. 30 tablet 0  . metoprolol (LOPRESSOR) 100 MG tablet Take 1 tablet (100 mg total) by mouth 2 (two) times daily. 60 tablet 0  . potassium chloride SA (K-DUR,KLOR-CON) 20 MEQ tablet Take 1 tablet (20 mEq total) by mouth daily. 30 tablet 0  . sitaGLIPtin-metformin (JANUMET) 50-1000 MG tablet Take 1 tablet by mouth daily. 30 tablet 0   No facility-administered medications prior to visit.     Allergies:   Review of patient's allergies indicates no known allergies.   Social History   Social History  .  Marital Status: Married    Spouse Name: N/A  . Number of Children: N/A  . Years of Education: N/A   Social History Main Topics  . Smoking status: Never Smoker   . Smokeless tobacco: Not on file  . Alcohol Use: No  . Drug Use: No  . Sexual Activity: Not on file   Other Topics Concern  . Not on file   Social History Narrative     Family History:  The patient's family history is not on file.   ROS:   Please see the history of present illness.    ROS All other systems reviewed and are negative.   Physical Exam:    VS:  There were no vitals taken for this visit.   GEN: Well nourished, well developed, in no acute distress HEENT: normal Neck: no JVD, no masses Cardiac: Normal S1/S2, RRR; no murmurs, rubs, or gallops, no edema;   carotid bruits,   Respiratory:  clear to auscultation bilaterally; no wheezing, rhonchi or rales GI: soft, nontender, nondistended, + BS MS: no deformity or atrophy Skin: warm and dry, no rash Neuro:  Bilateral strength equal, no focal deficits  Psych: Alert and oriented x 3, normal affect  Wt Readings from Last 3 Encounters:  10/26/15  364 lb 11.2 oz (165.427 kg)  04/03/14 364 lb (165.109 kg)      Studies/Labs Reviewed:     EKG:  EKG is  ordered today.  The ekg ordered today demonstrates   Recent Labs: 10/24/2015: B Natriuretic Peptide 245.9*; Hemoglobin 14.7; Platelets 208 10/25/2015: TSH 5.188* 10/26/2015: BUN 23*; Creatinine, Ser 1.19; Potassium 3.5; Sodium 139   Recent Lipid Panel    Component Value Date/Time   CHOL 200 10/25/2015 0035   TRIG 263* 10/25/2015 0035   HDL 41 10/25/2015 0035   CHOLHDL 4.9 10/25/2015 0035   VLDL 53* 10/25/2015 0035   LDLCALC 106* 10/25/2015 0035    Additional studies/ records that were reviewed today include:    Echo 10/26/15 Severe LVH, EF 60-65%, no RWMA, Gr 3 DD, mild MR, severe LAE, mild RAE, small to mod pericardial effusion   ASSESSMENT:     No diagnosis found.   PLAN:     In order of  problems listed above:  1.     Medication Adjustments/Labs and Tests Ordered: Current medicines are reviewed at length with the patient today.  Concerns regarding medicines are outlined above.  Medication changes, Labs and Tests ordered today are outlined in the Patient Instructions noted below. There are no Patient Instructions on file for this visit.  Signed, Tereso Newcomer, PA-C  10/30/2015 7:55 PM    [facility] [address] [place], [place], [place]  [address] Phone: [phone]; Fax: [phone]     This encounter was created in error - please disregard.

## 2015-10-30 NOTE — Telephone Encounter (Signed)
Called pt and left message asking pt to call back to update his Fm and medical Hx.

## 2015-10-31 ENCOUNTER — Encounter: Payer: Medicaid Other | Admitting: Physician Assistant

## 2015-11-03 ENCOUNTER — Ambulatory Visit (INDEPENDENT_AMBULATORY_CARE_PROVIDER_SITE_OTHER): Payer: Medicaid Other | Admitting: Nurse Practitioner

## 2015-11-03 ENCOUNTER — Encounter: Payer: Self-pay | Admitting: Nurse Practitioner

## 2015-11-03 VITALS — BP 150/98 | HR 56 | Ht 72.0 in | Wt 363.4 lb

## 2015-11-03 DIAGNOSIS — I5032 Chronic diastolic (congestive) heart failure: Secondary | ICD-10-CM | POA: Diagnosis not present

## 2015-11-03 DIAGNOSIS — I10 Essential (primary) hypertension: Secondary | ICD-10-CM | POA: Diagnosis not present

## 2015-11-03 MED ORDER — HYDRALAZINE HCL 25 MG PO TABS: 25.0000 mg | ORAL_TABLET | Freq: Three times a day (TID) | ORAL | Status: DC

## 2015-11-03 NOTE — Progress Notes (Signed)
CARDIOLOGY OFFICE NOTE  Date:  11/03/2015    Brett Flores Date of Birth: 1971-02-26 Medical Record #[id number]  PCP:  Dennard Schaumann, MD  Cardiologist:  Excell Seltzer    Chief Complaint  Patient presents with  . Congestive Heart Failure    Post hospital visit - seen for Dr. Excell Seltzer    History of Present Illness: Brett Flores is a 45 y.o. male who presents today for a post hospital visit. Seen for Dr. Excell Seltzer (NEW).   He as a PMH of hypertension, T2DM, obesity and heart failure who presented earlier this month to [facility] with 4-5 days of dyspnea with exertion as well as positional (lying flat) orthopnea that improved with rest and changes in position. No known CAD. He had been compliant with his medications including lasix, amlodipine, hctz, metoprolol and lisinopril until ~ 1 month ago when he ran out of medications. Quite hypertensive in the ER. Treated for diastolic HF. Risk of obstructive CAD felt to be relatively low. To consider outpatient stress testing although his body habitus makes this less ideal.  Comes back today. Here alone. Saw PCP yesterday. Works split shifts and came today from work - has not had his medicines yet today but otherwise - he has been taking. He feels much better. Tells me that he had not ran out of his medicines - just stopped taking and now understands what will happen to him. BP previously not at goal.  Not short of breath. Less swelling. He does not have scales at home.  Saw PCP yesterday and had labs drawn. He understands the need to restrict salt. No chest pain. He is wanting to get back into the bariatric program.   Past Medical History  Diagnosis Date  . Hypertension   . Diabetes mellitus without complication (HCC)   . Coronary artery disease   . CHF (congestive heart failure) (HCC)   . Obesity   . CKD (chronic kidney disease), stage II     History reviewed. No pertinent past surgical history.   Medications: Current Outpatient  Prescriptions  Medication Sig Dispense Refill  . amLODipine (NORVASC) 10 MG tablet Take 1 tablet (10 mg total) by mouth daily. 30 tablet 0  . aspirin EC 81 MG EC tablet Take 1 tablet (81 mg total) by mouth daily. 30 tablet 0  . atorvastatin (LIPITOR) 20 MG tablet Take 1 tablet (20 mg total) by mouth daily. Reported on 10/25/2015 30 tablet 0  . cloNIDine (CATAPRES) 0.1 MG tablet Take 1 tablet (0.1 mg total) by mouth 2 (two) times daily. 60 tablet 0  . Exenatide ER (BYDUREON) 2 MG PEN Inject 2 mg into the skin once a week.    . furosemide (LASIX) 40 MG tablet Take 1 tablet (40 mg total) by mouth daily. 30 tablet 0  . lisinopril (PRINIVIL,ZESTRIL) 40 MG tablet Take 1 tablet (40 mg total) by mouth daily. 30 tablet 0  . metoprolol (LOPRESSOR) 100 MG tablet Take 1 tablet (100 mg total) by mouth 2 (two) times daily. 60 tablet 0  . potassium chloride SA (K-DUR,KLOR-CON) 20 MEQ tablet Take 1 tablet (20 mEq total) by mouth daily. 30 tablet 0  . sitaGLIPtin-metformin (JANUMET) 50-1000 MG tablet Take 1 tablet by mouth daily. 30 tablet 0  . hydrALAZINE (APRESOLINE) 25 MG tablet Take 1 tablet (25 mg total) by mouth 3 (three) times daily. 90 tablet 3   No current facility-administered medications for this visit.    Allergies: No Known Allergies  Social History: The patient  reports that he has never smoked. He does not have any smokeless tobacco history on file. He reports that he does not drink alcohol or use illicit drugs.   Family History: The patient's family history is negative for Heart disease, Heart failure, and Hypertension.   Review of Systems: Please see the history of present illness.   Otherwise, the review of systems is positive for none.   All other systems are reviewed and negative.   Physical Exam: VS:  BP 150/98 mmHg  Pulse 56  Ht 6' (1.829 m)  Wt 363 lb 6.4 oz (164.837 kg)  BMI 49.28 kg/m2 .  BMI Body mass index is 49.28 kg/(m^2).  Wt Readings from Last 3 Encounters:  11/03/15  363 lb 6.4 oz (164.837 kg)  10/26/15 364 lb 11.2 oz (165.427 kg)  04/03/14 364 lb (165.109 kg)    General: Pleasant. He is an obese black male who is alert and in no acute distress.  HEENT: Normal. Neck: Supple, no JVD, carotid bruits, or masses noted.  Cardiac: Regular rate and rhythm. Heart tones distant.  Trace edema.  Respiratory:  Lungs are clear to auscultation bilaterally with normal work of breathing.  GI: Soft and nontender.  MS: No deformity or atrophy. Gait and ROM intact. Skin: Warm and dry. Color is normal.  Neuro:  Strength and sensation are intact and no gross focal deficits noted.  Psych: Alert, appropriate and with normal affect.   LABORATORY DATA:  EKG:  EKG is not ordered today.  Lab Results  Component Value Date   WBC 10.8* 10/24/2015   HGB 14.7 10/24/2015   HCT 44.0 10/24/2015   PLT 208 10/24/2015   GLUCOSE 245* 10/26/2015   CHOL 200 10/25/2015   TRIG 263* 10/25/2015   HDL 41 10/25/2015   LDLCALC 106* 10/25/2015   ALT 44 04/03/2014   AST 29 04/03/2014   NA 139 10/26/2015   K 3.5 10/26/2015   CL 102 10/26/2015   CREATININE 1.19 10/26/2015   BUN 23* 10/26/2015   CO2 27 10/26/2015   TSH 5.188* 10/25/2015   INR 1.08 10/25/2015   HGBA1C 10.5* 10/25/2015    BNP (last 3 results)  Recent Labs  10/24/15 2013  BNP 245.9*    ProBNP (last 3 results) No results for input(s): PROBNP in the last 8760 hours.   Other Studies Reviewed Today:  Echo Study Conclusions from 10/2015  - Left ventricle: The cavity size was normal. There was severe concentric hypertrophy. Systolic function was normal. The estimated ejection fraction was in the range of 60% to 65%. Wall motion was normal; there were no regional wall motion abnormalities. Doppler parameters are consistent with a reversible restrictive pattern, indicative of decreased left ventricular diastolic compliance and/or increased left atrial pressure (grade 3 diastolic dysfunction). -  Mitral valve: There was mild regurgitation. - Left atrium: The atrium was severely dilated. - Right ventricle: The cavity size was mildly dilated. Wall thickness was normal. - Right atrium: The atrium was mildly dilated. - Pericardium, extracardiac: A small to moderate pericardial effusion was identified circumferential to the heart.  Assessment/Plan:  1. Chronic diastolic HF - looks better clinically. We have discussed the need for modification of his risk factors with diastolic HF. He is on beta blocker, ACE, diuretic. Treat HTN. Restrict salt. Try to get a set of scales that will weigh him. See back in one month.   2. Elevated troponin, suspected that his mild troponin elevation with flat trend was  related to acute heart failure. The patient has no ischemic symptoms. He had nonspecific changes on his EKG, but risk of obstructive CAD is relatively low. Can consider outpatient stress testing although his body habitus makes this less than optimal. It was not felt that invasive cardiac evaluation is indicated at this time. May need stress testing if he wishes to proceed with bariatric surgery.   3. Hypertension with heart failure: BP not at goal - adding Hydralazine 25 mg TID today.   4. Obesity  5. DM - uncontrolled. Saw PCP yesterday. Seems more motivated to make changes and stay on his medicines.   6. Noncompliance - seems motivated.   Current medicines are reviewed with the patient today.  The patient does not have concerns regarding medicines other than what has been noted above.  The following changes have been made:  See above.  Labs/ tests ordered today include:   No orders of the defined types were placed in this encounter.     Disposition:   FU with me in one month.   Patient is agreeable to this plan and will call if any problems develop in the interim.   Signed: Rosalio Macadamia, RN, ANP-C 11/03/2015 9:34 AM  [facility] [address] [place], [place]  [address] Phone: [phone] Fax: [phone]

## 2015-11-03 NOTE — Patient Instructions (Addendum)
We will be checking the following labs today - NONE   Medication Instructions:    Continue with your current medicines. BUT  I am adding Hydralazine 25 mg to take 3 times a day - this is at the drug store.     Testing/Procedures To Be Arranged:  N/A  Follow-Up:   See me in about a month    Other Special Instructions:   Go to Medical Supply store and see if you can get scales to weigh - [address] in [place]  Restrict salt    If you need a refill on your cardiac medications before your next appointment, please call your pharmacy.   Call the [facility] office at [phone] if you have any questions, problems or concerns.

## 2015-12-15 ENCOUNTER — Ambulatory Visit: Payer: Medicaid Other | Admitting: Nurse Practitioner

## 2016-01-04 ENCOUNTER — Encounter: Payer: Self-pay | Admitting: Nurse Practitioner

## 2017-04-22 IMAGING — CR DG CHEST 2V
2 series · 2 of 2 positions shown · non-contrast
Comparison: Radiograph dated 04/03/2014

CLINICAL DATA: 44-year-old male with shortness of breath

EXAM:
CHEST  2 VIEW

[w chest pa]
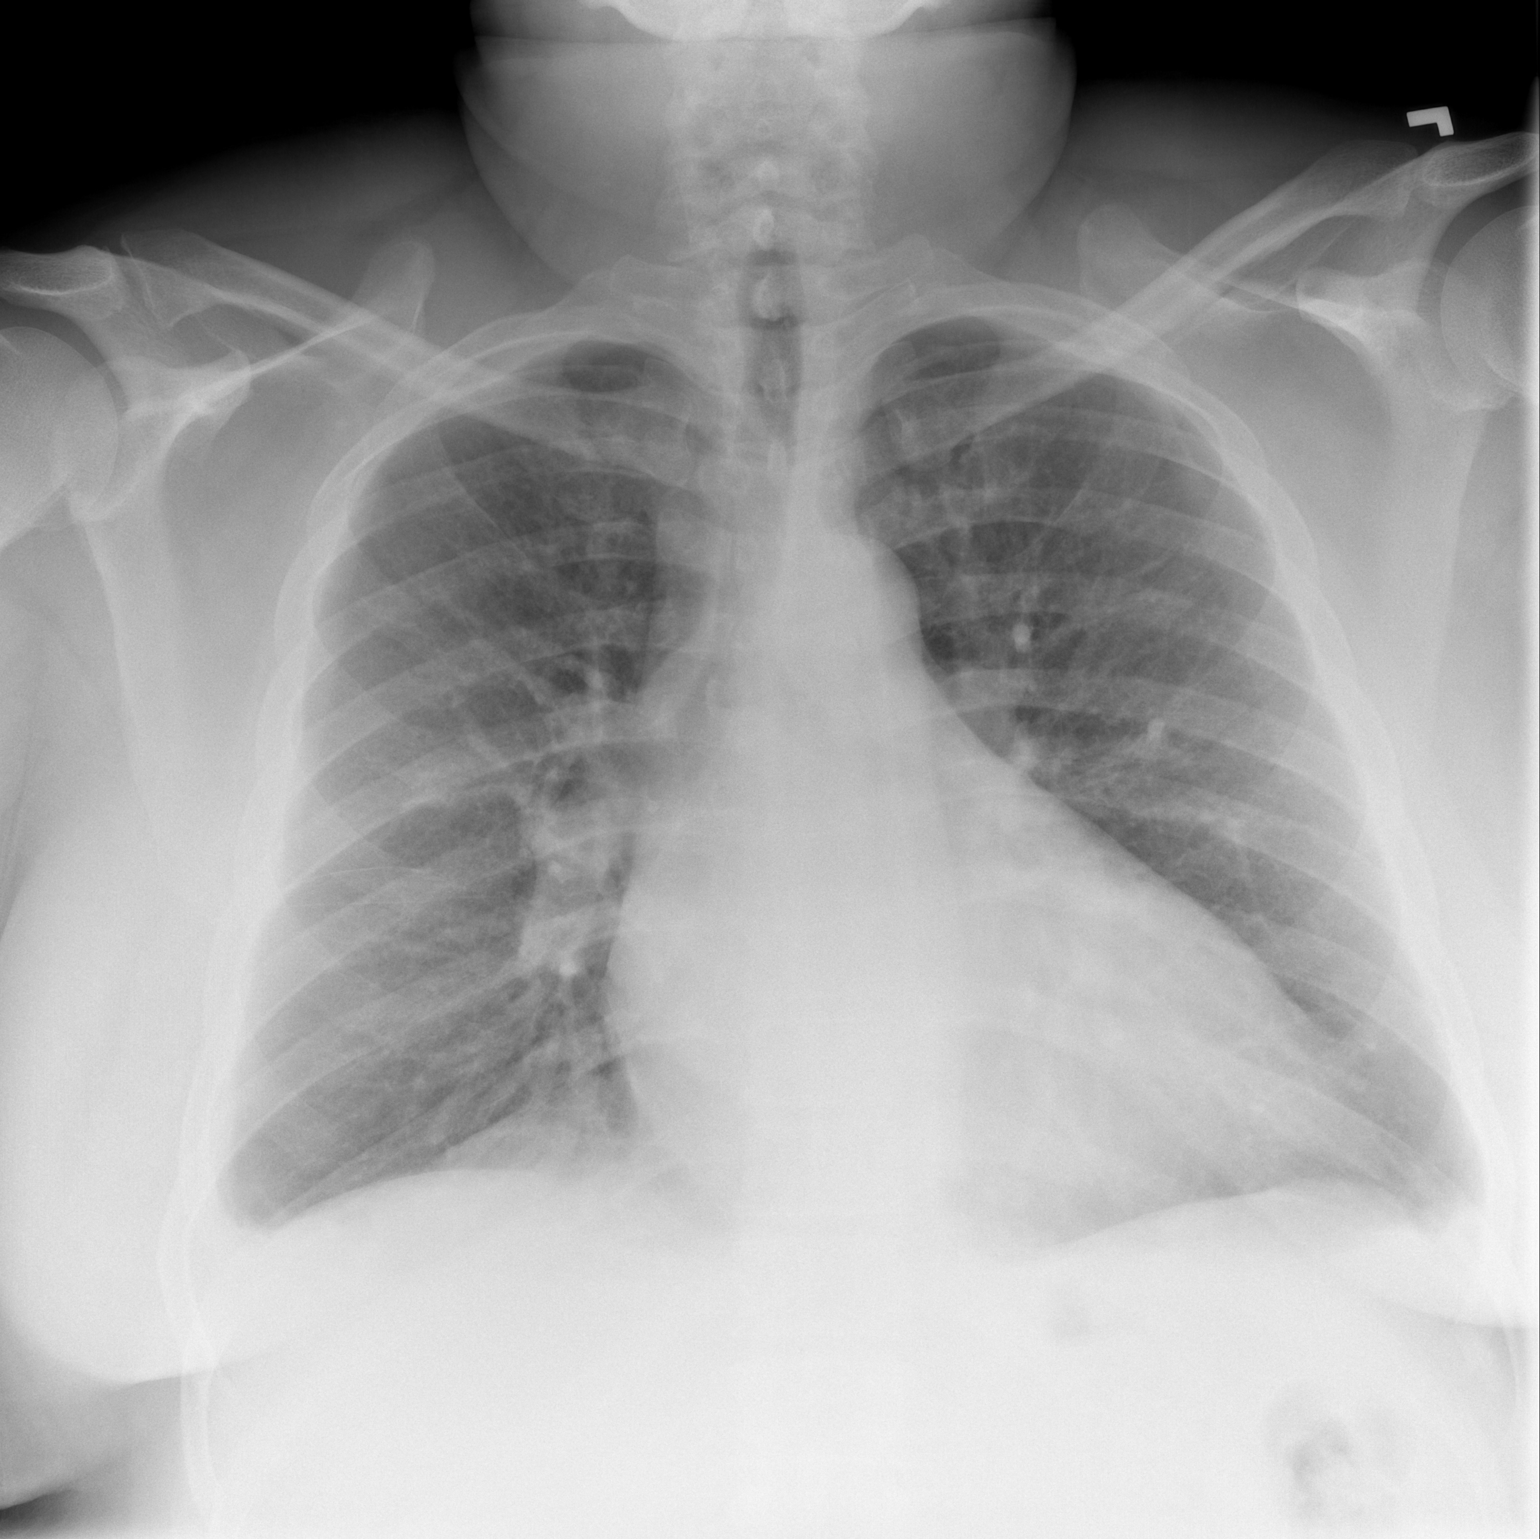

[w chest lat]
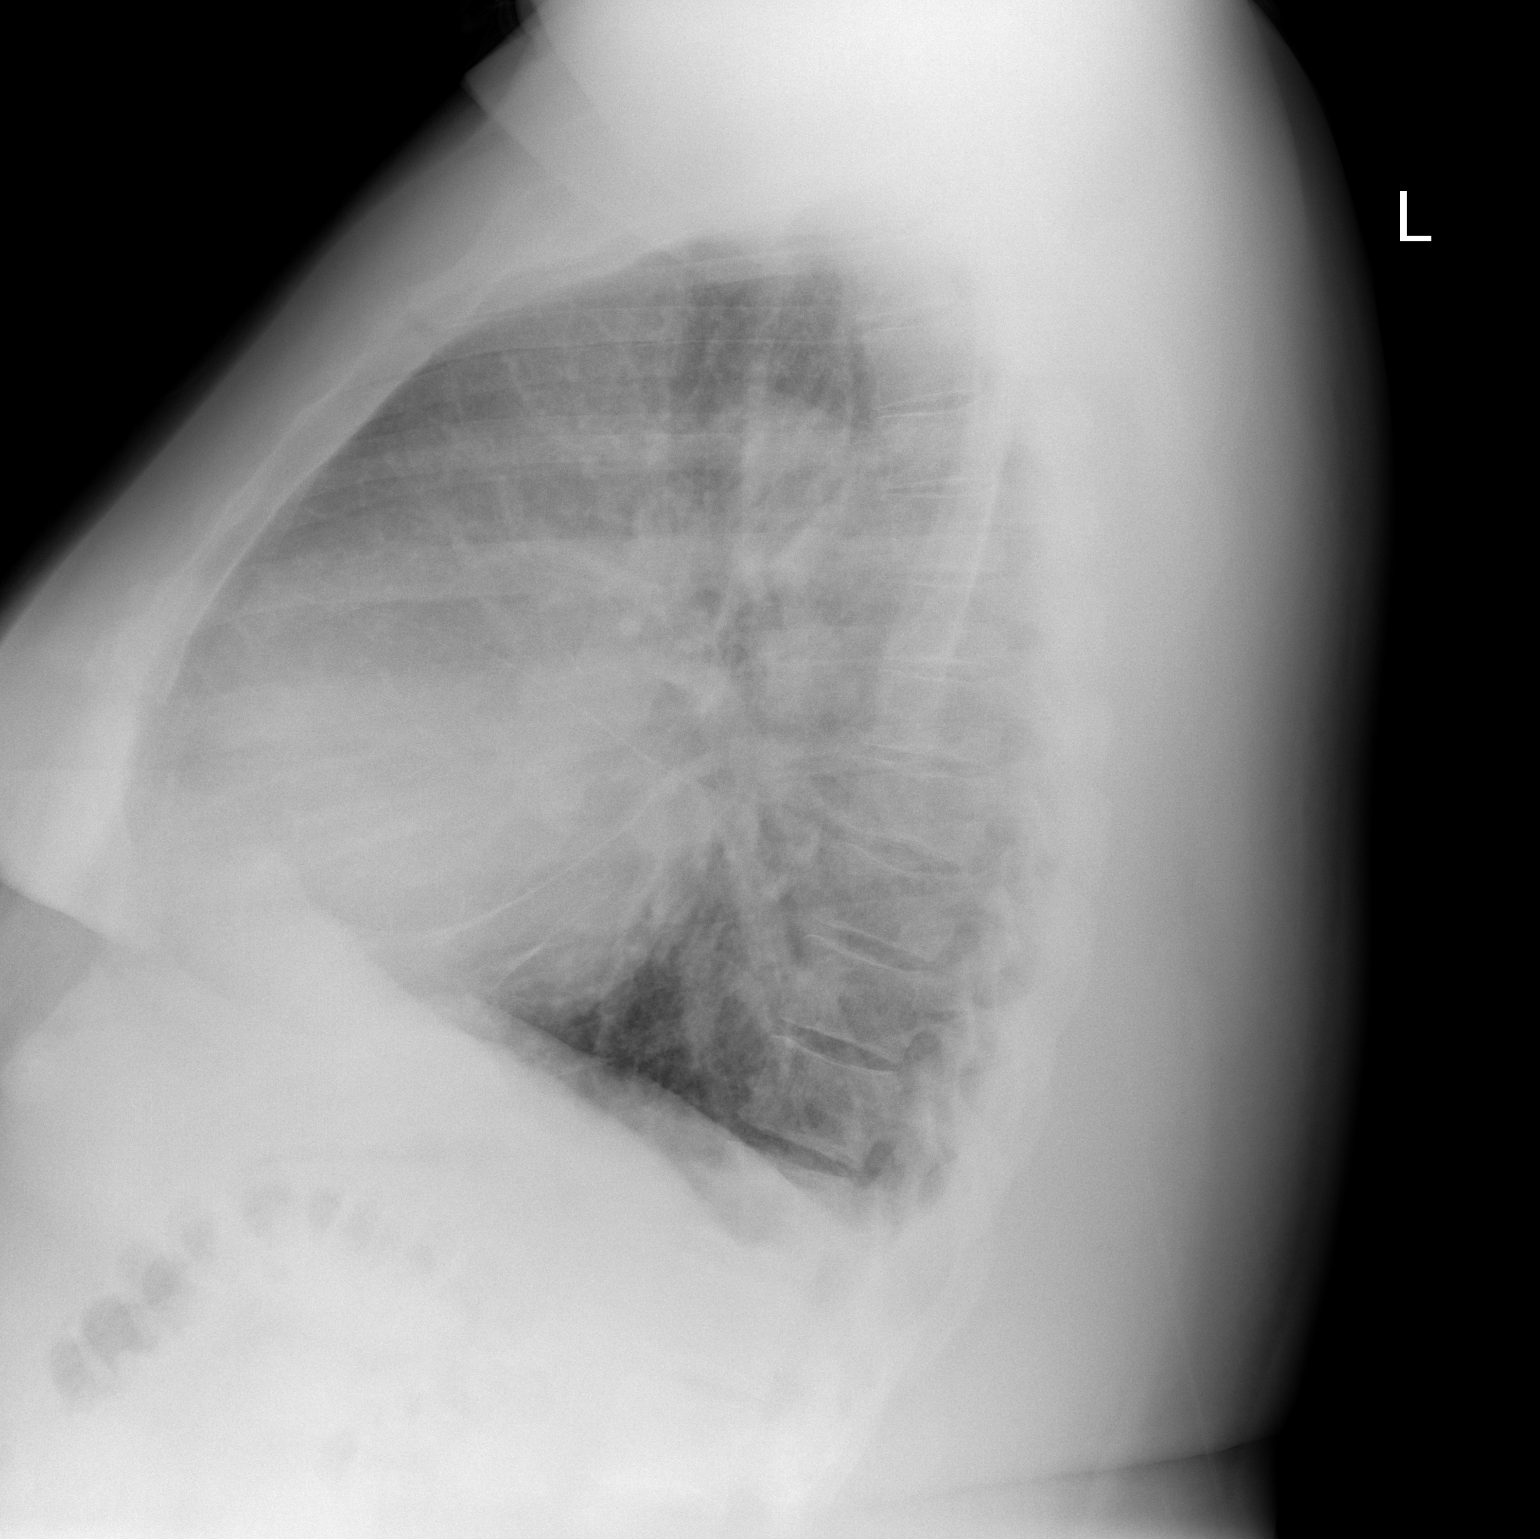

[2 of 2 positions shown; findings below may reference images not displayed]

FINDINGS: Two views of the chest do not demonstrate a focal consolidation.
There are blunting of the costophrenic angles bilaterally which may
represent small pleural effusion. No pneumothorax. There is stable
cardiomegaly. No acute osseous pathology.
IMPRESSION: Small bilateral pleural effusions. No focal consolidation or
pneumothorax.

## 2018-04-02 ENCOUNTER — Emergency Department (HOSPITAL_BASED_OUTPATIENT_CLINIC_OR_DEPARTMENT_OTHER)
Admission: EM | Admit: 2018-04-02 | Discharge: 2018-04-02 | Disposition: A | Discharge status: Home / Self Care | Payer: PRIVATE HEALTH INSURANCE | Attending: Emergency Medicine | Admitting: Emergency Medicine

## 2018-04-02 ENCOUNTER — Emergency Department (HOSPITAL_BASED_OUTPATIENT_CLINIC_OR_DEPARTMENT_OTHER): Payer: PRIVATE HEALTH INSURANCE

## 2018-04-02 ENCOUNTER — Other Ambulatory Visit: Payer: Self-pay

## 2018-04-02 ENCOUNTER — Encounter (HOSPITAL_BASED_OUTPATIENT_CLINIC_OR_DEPARTMENT_OTHER): Payer: Self-pay | Admitting: Emergency Medicine

## 2018-04-02 DIAGNOSIS — Z79899 Other long term (current) drug therapy: Secondary | ICD-10-CM | POA: Diagnosis not present

## 2018-04-02 DIAGNOSIS — I130 Hypertensive heart and chronic kidney disease with heart failure and stage 1 through stage 4 chronic kidney disease, or unspecified chronic kidney disease: Secondary | ICD-10-CM | POA: Insufficient documentation

## 2018-04-02 DIAGNOSIS — Z7982 Long term (current) use of aspirin: Secondary | ICD-10-CM | POA: Insufficient documentation

## 2018-04-02 DIAGNOSIS — R0602 Shortness of breath: Secondary | ICD-10-CM | POA: Diagnosis present

## 2018-04-02 DIAGNOSIS — E1122 Type 2 diabetes mellitus with diabetic chronic kidney disease: Secondary | ICD-10-CM | POA: Insufficient documentation

## 2018-04-02 DIAGNOSIS — I2510 Atherosclerotic heart disease of native coronary artery without angina pectoris: Secondary | ICD-10-CM | POA: Insufficient documentation

## 2018-04-02 DIAGNOSIS — Z9119 Patient's noncompliance with other medical treatment and regimen: Secondary | ICD-10-CM | POA: Insufficient documentation

## 2018-04-02 DIAGNOSIS — Z7984 Long term (current) use of oral hypoglycemic drugs: Secondary | ICD-10-CM | POA: Insufficient documentation

## 2018-04-02 DIAGNOSIS — I509 Heart failure, unspecified: Secondary | ICD-10-CM | POA: Diagnosis not present

## 2018-04-02 DIAGNOSIS — N182 Chronic kidney disease, stage 2 (mild): Secondary | ICD-10-CM | POA: Insufficient documentation

## 2018-04-02 LAB — CBG MONITORING, ED: GLUCOSE-CAPILLARY: 420 mg/dL — AB (ref 70–99)

## 2018-04-02 LAB — CBC WITH DIFFERENTIAL/PLATELET
Basophils Absolute: 0.1 10*3/uL (ref 0.0–0.1)
Basophils Relative: 1 %
Eosinophils Absolute: 0.2 10*3/uL (ref 0.0–0.7)
Eosinophils Relative: 3 %
HCT: 40.5 % (ref 39.0–52.0)
HEMOGLOBIN: 14.4 g/dL (ref 13.0–17.0)
LYMPHS ABS: 1.2 10*3/uL (ref 0.7–4.0)
LYMPHS PCT: 15 %
MCH: 29.5 pg (ref 26.0–34.0)
MCHC: 35.6 g/dL (ref 30.0–36.0)
MCV: 83 fL (ref 78.0–100.0)
Monocytes Absolute: 0.6 10*3/uL (ref 0.1–1.0)
Monocytes Relative: 7 %
NEUTROS PCT: 74 %
Neutro Abs: 6 10*3/uL (ref 1.7–7.7)
PLATELETS: 228 10*3/uL (ref 150–400)
RBC: 4.88 MIL/uL (ref 4.22–5.81)
RDW: 13.1 % (ref 11.5–15.5)
WBC: 8.1 10*3/uL (ref 4.0–10.5)

## 2018-04-02 LAB — BASIC METABOLIC PANEL
Anion gap: 10 (ref 5–15)
BUN: 19 mg/dL (ref 6–20)
CHLORIDE: 100 mmol/L (ref 98–111)
CO2: 23 mmol/L (ref 22–32)
Calcium: 8.5 mg/dL — ABNORMAL LOW (ref 8.9–10.3)
Creatinine, Ser: 1.45 mg/dL — ABNORMAL HIGH (ref 0.61–1.24)
GFR calc non Af Amer: 57 mL/min — ABNORMAL LOW (ref >60)
Glucose, Bld: 456 mg/dL — ABNORMAL HIGH (ref 70–99)
Potassium: 3.2 mmol/L — ABNORMAL LOW (ref 3.5–5.1)
Sodium: 133 mmol/L — ABNORMAL LOW (ref 135–145)

## 2018-04-02 LAB — TROPONIN I
TROPONIN I: 0.25 ng/mL — AB (ref <0.03)
Troponin I: 0.24 ng/mL (ref <0.03)

## 2018-04-02 LAB — BRAIN NATRIURETIC PEPTIDE: B Natriuretic Peptide: 477 pg/mL — ABNORMAL HIGH (ref 0.0–100.0)

## 2018-04-02 MED ORDER — POTASSIUM CHLORIDE CRYS ER 20 MEQ PO TBCR: 20.0000 meq | EXTENDED_RELEASE_TABLET | Freq: Every day | ORAL | 1 refills | Status: AC

## 2018-04-02 MED ORDER — CLONIDINE HCL 0.1 MG PO TABS: 0.1000 mg | ORAL_TABLET | Freq: Two times a day (BID) | ORAL | 1 refills | Status: AC

## 2018-04-02 MED ORDER — ATORVASTATIN CALCIUM 20 MG PO TABS: 20.0000 mg | ORAL_TABLET | Freq: Every day | ORAL | 1 refills | Status: AC

## 2018-04-02 MED ORDER — FUROSEMIDE 10 MG/ML IJ SOLN
40.0000 mg | Freq: Once | INTRAMUSCULAR | Status: AC
Start: 2018-04-02 — End: 2018-04-02
  Administered 2018-04-02: 40 mg via INTRAVENOUS
  Filled 2018-04-02: qty 4

## 2018-04-02 MED ORDER — FUROSEMIDE 40 MG PO TABS: 40.0000 mg | ORAL_TABLET | Freq: Every day | ORAL | 1 refills | Status: AC

## 2018-04-02 MED ORDER — HYDRALAZINE HCL 25 MG PO TABS: 25.0000 mg | ORAL_TABLET | Freq: Three times a day (TID) | ORAL | 1 refills | Status: AC

## 2018-04-02 MED ORDER — LISINOPRIL 40 MG PO TABS: 40.0000 mg | ORAL_TABLET | Freq: Every day | ORAL | 1 refills | Status: AC

## 2018-04-02 MED ORDER — METOPROLOL TARTRATE 100 MG PO TABS: 100.0000 mg | ORAL_TABLET | Freq: Two times a day (BID) | ORAL | 1 refills | Status: AC

## 2018-04-02 MED ORDER — INSULIN REGULAR HUMAN 100 UNIT/ML IJ SOLN
8.0000 [IU] | Freq: Once | INTRAMUSCULAR | Status: AC
  Administered 2018-04-02: 8 [IU] via SUBCUTANEOUS
  Filled 2018-04-02: qty 1

## 2018-04-02 MED ORDER — METFORMIN HCL 500 MG PO TABS: 500.0000 mg | ORAL_TABLET | Freq: Two times a day (BID) | ORAL | 1 refills | Status: AC

## 2018-04-02 NOTE — ED Notes (Signed)
Dc instructions and prescriptions given to the pt. Pt and family verbalized understanding, pt unable to sing for dc do to signature pad not working.

## 2018-04-02 NOTE — Discharge Instructions (Addendum)
Begin taking your medications again as prescribed this evening.  Case management has been consulted and should follow your situation as an outpatient.  Return to the emergency department if you develop chest pain, worsening breathing, or other new and concerning symptoms.

## 2018-04-02 NOTE — ED Provider Notes (Signed)
[facility] EMERGENCY DEPARTMENT Provider Note   CSN: [id number] Arrival date & time: 04/02/18  1828     History   Chief Complaint Chief Complaint  Patient presents with  . Shortness of Breath    HPI Jah Alarid is a 47 y.o. male.  Patient is a 47 year old male with past medical history of CHF, chronic renal insufficiency, diabetes, and obesity.  He presents today for evaluation of shortness of breath and leg swelling.  This is worsened over the past several weeks.  He reports running out of his blood pressure and heart medications 2 months ago and has not had them since.  He denies any chest pain, fevers, or chills.  His breathing is worse when he lies flat and with exertion.  The history is provided by the patient.  Shortness of Breath  This is a recurrent problem. The average episode lasts 1 week. The problem occurs continuously.The problem has been gradually worsening. Associated symptoms include leg swelling. Pertinent negatives include no fever, no cough, no sputum production and no chest pain. It is unknown what precipitated the problem. He has tried nothing for the symptoms.    Past Medical History:  Diagnosis Date  . CHF (congestive heart failure) (HCC)   . CKD (chronic kidney disease), stage II   . Coronary artery disease   . Diabetes mellitus without complication (HCC)   . Hypertension   . Obesity     Patient Active Problem List   Diagnosis Date Noted  . SOB (shortness of breath) 10/25/2015  . Acute respiratory failure (HCC) 10/25/2015  . CKD (chronic kidney disease), stage II   . Hypertension   . Diabetes mellitus without complication (HCC)   . Coronary artery disease   . CHF (congestive heart failure) (HCC)   . Obesity   . Acute on chronic congestive heart failure (HCC)   . Elevated troponin   . Acute diastolic heart failure (HCC)   . Shortness of breath 10/24/2015    History reviewed. No pertinent surgical history.      Home  Medications    Prior to Admission medications   Medication Sig Start Date End Date Taking? Authorizing Provider  amLODipine (NORVASC) 10 MG tablet Take 1 tablet (10 mg total) by mouth daily. 10/26/15   Ghimire, Werner Lean, MD  aspirin EC 81 MG EC tablet Take 1 tablet (81 mg total) by mouth daily. 10/26/15   Ghimire, Werner Lean, MD  atorvastatin (LIPITOR) 20 MG tablet Take 1 tablet (20 mg total) by mouth daily. Reported on 10/25/2015 10/26/15   Maretta Bees, MD  cloNIDine (CATAPRES) 0.1 MG tablet Take 1 tablet (0.1 mg total) by mouth 2 (two) times daily. 10/26/15   Ghimire, Werner Lean, MD  Exenatide ER (BYDUREON) 2 MG PEN Inject 2 mg into the skin once a week.    [provider]  furosemide (LASIX) 40 MG tablet Take 1 tablet (40 mg total) by mouth daily. 10/26/15   Ghimire, Werner Lean, MD  hydrALAZINE (APRESOLINE) 25 MG tablet Take 1 tablet (25 mg total) by mouth 3 (three) times daily. 11/03/15   Rosalio Macadamia, NP  lisinopril (PRINIVIL,ZESTRIL) 40 MG tablet Take 1 tablet (40 mg total) by mouth daily. 10/26/15   Ghimire, Werner Lean, MD  metoprolol (LOPRESSOR) 100 MG tablet Take 1 tablet (100 mg total) by mouth 2 (two) times daily. 10/26/15   Ghimire, Werner Lean, MD  potassium chloride SA (K-DUR,KLOR-CON) 20 MEQ tablet Take 1 tablet (20 mEq total) by  mouth daily. 10/26/15   Ghimire, Werner LeanShanker M, MD  sitaGLIPtin-metformin (JANUMET) 50-1000 MG tablet Take 1 tablet by mouth daily. 10/26/15   Ghimire, Werner LeanShanker M, MD    Family History Family History  Problem Relation Age of Onset  . Heart disease Neg Hx   . Heart failure Neg Hx   . Hypertension Neg Hx     Social History Social History   Tobacco Use  . Smoking status: Never Smoker  . Smokeless tobacco: Never Used  Substance Use Topics  . Alcohol use: No  . Drug use: No     Allergies   Patient has no known allergies.   Review of Systems Review of Systems  Constitutional: Negative for fever.  Respiratory: Positive for shortness of breath.  Negative for cough and sputum production.   Cardiovascular: Positive for leg swelling. Negative for chest pain.  All other systems reviewed and are negative.    Physical Exam Updated Vital Signs BP (!) 224/144 (BP Location: Left Arm)   Pulse (!) 102   Temp 98.4 F (36.9 C) (Oral)   Resp (!) 28   Ht 6\' 1"  (1.854 m)   Wt (!) 154.2 kg (340 lb)   SpO2 90%   BMI 44.86 kg/m   Physical Exam  Constitutional: He is oriented to person, place, and time. He appears well-developed and well-nourished. No distress.  HENT:  Head: Normocephalic and atraumatic.  Mouth/Throat: Oropharynx is clear and moist.  Neck: Normal range of motion. Neck supple.  Cardiovascular: Normal rate and regular rhythm. Exam reveals no friction rub.  No murmur heard. Pulmonary/Chest: Effort normal. No respiratory distress. He has no wheezes. He has rales in the right lower field and the left lower field.  There are rales in the bases bilaterally.  Abdominal: Soft. Bowel sounds are normal. He exhibits no distension. There is no tenderness.  Musculoskeletal: Normal range of motion.       Right lower leg: He exhibits edema.       Left lower leg: He exhibits edema.  There is 2+ pitting edema to both lower extremities.  Neurological: He is alert and oriented to person, place, and time. Coordination normal.  Skin: Skin is warm and dry. He is not diaphoretic.  Nursing note and vitals reviewed.    ED Treatments / Results  Labs (all labs ordered are listed, but only abnormal results are displayed) Labs Reviewed  BASIC METABOLIC PANEL  TROPONIN I  CBC WITH DIFFERENTIAL/PLATELET  BRAIN NATRIURETIC PEPTIDE    EKG EKG Interpretation  Date/Time:  Thursday April 02 2018 18:45:23 EDT Ventricular Rate:  98 PR Interval:    QRS Duration: 108 QT Interval:  381 QTC Calculation: 487 R Axis:   -90 Text Interpretation:  Sinus rhythm Prolonged PR interval Left anterior fascicular block Abnormal R-wave progression, late  transition Borderline prolonged QT interval Confirmed by Geoffery LyonseLo, Dodi Leu ([id number]) on 04/02/2018 6:49:32 PM   Radiology No results found.  Procedures Procedures (including critical care time)  Medications Ordered in ED Medications  furosemide (LASIX) injection 40 mg (has no administration in time range)     Initial Impression / Assessment and Plan / ED Course  I have reviewed the triage vital signs and the nursing notes.  Pertinent labs & imaging results that were available during my care of the patient were reviewed by me and considered in my medical decision making (see chart for details).  Patient with history of congestive heart failure, obesity.  He has been off his medications for the  past 2 months.  He presents today with what appears to be an exacerbation of CHF.  He has lower extremity edema, elevated BNP, and findings of mild pulmonary edema.  He also has a mildly elevated troponin.  His troponin has been elevated in the past, thought to be related to hypertension and CHF.  He is not complaining of any chest pain and repeat troponin is unchanged.  I doubt an acute coronary event is occurring.  I suspect this mild elevation of troponin is demand related to his hypertension and volume overload.  He was given IV Lasix with good results.  This patient needs to be back on his medications and refills will be supplied.  He is also inquired about financial assistance for his medications.  A consult will be placed to case management who can follow him up as an outpatient.  Final Clinical Impressions(s) / ED Diagnoses   Final diagnoses:  None    ED Discharge Orders    None       Geoffery Lyons, MD 04/02/18 2230

## 2018-04-02 NOTE — ED Notes (Signed)
Date and time results received: 04/02/18 2202 (use smartphrase ".now" to insert current time)  Test: troponin Critical Value: 0.25  Name of Provider Notified: Dr. Judd Lienelo  Orders Received? Or Actions Taken?: no new orders

## 2018-04-02 NOTE — ED Triage Notes (Signed)
SOB, worsening over the past month. Hx of CHF. Ran out of meds and does not currently have a PCP.

## 2018-04-02 NOTE — ED Notes (Signed)
Date and time results received: 04/02/18 1937 (use smartphrase ".now" to insert current time)  Test: troponin Critical Value: 0.24  Name of Provider Notified: Dr. Judd Lienelo  Orders Received? Or Actions Taken?: awaiting additional orders

## 2018-04-02 NOTE — ED Notes (Signed)
Pt sts out of all meds except amlodipine x several months.

## 2018-04-03 ENCOUNTER — Telehealth: Payer: Self-pay | Admitting: Emergency Medicine

## 2018-04-03 NOTE — Care Management Note (Signed)
Case Management Note  Please see related telephone encounter for details.  Updated Dr. Judd Lienelo via messages.  No further CM needs noted at this time.  Tryniti Laatsch, Lynnae SandhoffAngela N, RN 04/03/2018, 10:02 AM

## 2018-04-03 NOTE — Telephone Encounter (Signed)
CM consulted for no pcp with need for follow up.  CM contacted Mr. Brett Flores and advised him to call the number on the back of his insurance card to find a doctor that was in-network.  No further CM needs noted at this time.

## 2018-10-19 ENCOUNTER — Other Ambulatory Visit: Payer: Self-pay

## 2018-10-19 ENCOUNTER — Emergency Department (HOSPITAL_BASED_OUTPATIENT_CLINIC_OR_DEPARTMENT_OTHER)
Admission: EM | Admit: 2018-10-19 | Discharge: 2018-10-19 | Disposition: A | Discharge status: Home / Self Care | Payer: BLUE CROSS/BLUE SHIELD | Attending: Emergency Medicine | Admitting: Emergency Medicine

## 2018-10-19 ENCOUNTER — Encounter (HOSPITAL_BASED_OUTPATIENT_CLINIC_OR_DEPARTMENT_OTHER): Payer: Self-pay | Admitting: Emergency Medicine

## 2018-10-19 DIAGNOSIS — Z79899 Other long term (current) drug therapy: Secondary | ICD-10-CM | POA: Insufficient documentation

## 2018-10-19 DIAGNOSIS — Y998 Other external cause status: Secondary | ICD-10-CM | POA: Diagnosis not present

## 2018-10-19 DIAGNOSIS — N182 Chronic kidney disease, stage 2 (mild): Secondary | ICD-10-CM | POA: Insufficient documentation

## 2018-10-19 DIAGNOSIS — I130 Hypertensive heart and chronic kidney disease with heart failure and stage 1 through stage 4 chronic kidney disease, or unspecified chronic kidney disease: Secondary | ICD-10-CM | POA: Diagnosis not present

## 2018-10-19 DIAGNOSIS — S99921A Unspecified injury of right foot, initial encounter: Secondary | ICD-10-CM | POA: Diagnosis present

## 2018-10-19 DIAGNOSIS — I2510 Atherosclerotic heart disease of native coronary artery without angina pectoris: Secondary | ICD-10-CM | POA: Insufficient documentation

## 2018-10-19 DIAGNOSIS — W228XXA Striking against or struck by other objects, initial encounter: Secondary | ICD-10-CM | POA: Diagnosis not present

## 2018-10-19 DIAGNOSIS — I509 Heart failure, unspecified: Secondary | ICD-10-CM | POA: Diagnosis not present

## 2018-10-19 DIAGNOSIS — Y9389 Activity, other specified: Secondary | ICD-10-CM | POA: Insufficient documentation

## 2018-10-19 DIAGNOSIS — E1122 Type 2 diabetes mellitus with diabetic chronic kidney disease: Secondary | ICD-10-CM | POA: Diagnosis not present

## 2018-10-19 DIAGNOSIS — Z7982 Long term (current) use of aspirin: Secondary | ICD-10-CM | POA: Insufficient documentation

## 2018-10-19 DIAGNOSIS — L609 Nail disorder, unspecified: Secondary | ICD-10-CM

## 2018-10-19 DIAGNOSIS — Y92003 Bedroom of unspecified non-institutional (private) residence as the place of occurrence of the external cause: Secondary | ICD-10-CM | POA: Diagnosis not present

## 2018-10-19 DIAGNOSIS — S91202A Unspecified open wound of left great toe with damage to nail, initial encounter: Secondary | ICD-10-CM | POA: Diagnosis not present

## 2018-10-19 MED ORDER — CEPHALEXIN 500 MG PO CAPS: 500.0000 mg | ORAL_CAPSULE | Freq: Three times a day (TID) | ORAL | 0 refills | Status: AC

## 2018-10-19 NOTE — ED Notes (Signed)
NAD at this time. Pt is stable and going home.  

## 2018-10-19 NOTE — ED Provider Notes (Signed)
[facility] EMERGENCY DEPARTMENT Provider Note   CSN: [id number] Arrival date & time: 10/19/18  1034     History   Chief Complaint Chief Complaint  Patient presents with  . Nail Problem    diabetic     HPI Brett Flores is a 48 y.o. male.  The history is provided by the patient and medical records. No language interpreter was used.   Brett Flores is a 48 y.o. male  with a PMH of multiple comorbidities including diabetes who presents to the Emergency Department complaining of losing his toenail on the right foot.  Patient states that he hit his big toe on the corner of his bed last night and his nail came off.  He denies any pain today.  No drainage, redness or warmth.  He is a diabetic, therefore does not have great sensation to the foot, but no change from his baseline.  No fevers.  No change in mobility or range of motion.  He has been told to watch his feet closely because of his diabetes and was concerned about infection with his toenail.  Past Medical History:  Diagnosis Date  . CHF (congestive heart failure) (HCC)   . CKD (chronic kidney disease), stage II   . Coronary artery disease   . Diabetes mellitus without complication (HCC)   . Hypertension   . Obesity     Patient Active Problem List   Diagnosis Date Noted  . SOB (shortness of breath) 10/25/2015  . Acute respiratory failure (HCC) 10/25/2015  . CKD (chronic kidney disease), stage II   . Hypertension   . Diabetes mellitus without complication (HCC)   . Coronary artery disease   . CHF (congestive heart failure) (HCC)   . Obesity   . Acute on chronic congestive heart failure (HCC)   . Elevated troponin   . Acute diastolic heart failure (HCC)   . Shortness of breath 10/24/2015    History reviewed. No pertinent surgical history.      Home Medications    Prior to Admission medications   Medication Sig Start Date End Date Taking? Authorizing Provider  amLODipine (NORVASC) 10 MG tablet Take 1  tablet (10 mg total) by mouth daily. 10/26/15   Ghimire, Werner Lean, MD  aspirin EC 81 MG EC tablet Take 1 tablet (81 mg total) by mouth daily. 10/26/15   Ghimire, Werner Lean, MD  atorvastatin (LIPITOR) 20 MG tablet Take 1 tablet (20 mg total) by mouth daily. Reported on 10/25/2015 04/02/18   Geoffery Lyons, MD  cephALEXin (KEFLEX) 500 MG capsule Take 1 capsule (500 mg total) by mouth 3 (three) times daily. 10/19/18   Paytan Recine, Chase Picket, PA-C  cloNIDine (CATAPRES) 0.1 MG tablet Take 1 tablet (0.1 mg total) by mouth 2 (two) times daily. 04/02/18   Geoffery Lyons, MD  Exenatide ER (BYDUREON) 2 MG PEN Inject 2 mg into the skin once a week.    [provider]  furosemide (LASIX) 40 MG tablet Take 1 tablet (40 mg total) by mouth daily. 04/02/18   Geoffery Lyons, MD  hydrALAZINE (APRESOLINE) 25 MG tablet Take 1 tablet (25 mg total) by mouth 3 (three) times daily. 04/02/18   Geoffery Lyons, MD  lisinopril (PRINIVIL,ZESTRIL) 40 MG tablet Take 1 tablet (40 mg total) by mouth daily. 04/02/18   Geoffery Lyons, MD  metFORMIN (GLUCOPHAGE) 500 MG tablet Take 1 tablet (500 mg total) by mouth 2 (two) times daily with a meal. 04/02/18   Geoffery Lyons, MD  metoprolol  tartrate (LOPRESSOR) 100 MG tablet Take 1 tablet (100 mg total) by mouth 2 (two) times daily. 04/02/18   Geoffery Lyonselo, Douglas, MD  potassium chloride SA (K-DUR,KLOR-CON) 20 MEQ tablet Take 1 tablet (20 mEq total) by mouth daily. 04/02/18   Geoffery Lyonselo, Douglas, MD  sitaGLIPtin-metformin (JANUMET) 50-1000 MG tablet Take 1 tablet by mouth daily. 10/26/15   Ghimire, Werner LeanShanker M, MD    Family History Family History  Problem Relation Age of Onset  . Heart disease Neg Hx   . Heart failure Neg Hx   . Hypertension Neg Hx     Social History Social History   Tobacco Use  . Smoking status: Never Smoker  . Smokeless tobacco: Never Used  Substance Use Topics  . Alcohol use: No  . Drug use: No     Allergies   Patient has no known allergies.   Review of Systems Review of  Systems  Constitutional: Negative for fever.  Musculoskeletal: Negative for arthralgias and myalgias.  Skin: Positive for wound. Negative for color change.  Neurological: Negative for weakness and numbness.     Physical Exam Updated Vital Signs BP (!) 177/106   Pulse 85   Temp 98.4 F (36.9 C) (Oral)   Resp 17   SpO2 95%   Physical Exam Vitals signs and nursing note reviewed.  Constitutional:      General: He is not in acute distress.    Appearance: He is well-developed.  HENT:     Head: Normocephalic and atraumatic.  Neck:     Musculoskeletal: Neck supple.  Cardiovascular:     Rate and Rhythm: Normal rate and regular rhythm.     Heart sounds: Normal heart sounds. No murmur.  Pulmonary:     Effort: Pulmonary effort is normal. No respiratory distress.     Breath sounds: Normal breath sounds. No wheezing or rales.  Musculoskeletal: Normal range of motion.     Comments: Left great toe with no tenderness to palpation.  No swelling, erythema or warmth.  Toenail absent.  No damage to the nailbed.  Skin:    General: Skin is warm and dry.  Neurological:     Mental Status: He is alert.      ED Treatments / Results  Labs (all labs ordered are listed, but only abnormal results are displayed) Labs Reviewed - No data to display  EKG None  Radiology No results found.  Procedures Procedures (including critical care time)  Medications Ordered in ED Medications - No data to display   Initial Impression / Assessment and Plan / ED Course  I have reviewed the triage vital signs and the nursing notes.  Pertinent labs & imaging results that were available during my care of the patient were reviewed by me and considered in my medical decision making (see chart for details).    Brett Flores is a [age] y.o. male with past medical history of diabetes who presents the emergency department after striking his great toe against his bed yesterday resulting in losing his toenail.  On  exam, the toenail is absent, but the nailbed is intact without damage.  There are no signs of infection currently, although with his diabetes, body habitus and decreased sensation, feel that he is certainly at high risk to develop an infection.  Xeroform dressing placed on the nailbed for protection.  We will give him a few days worth of prophylactic antibiotics to help prevent infection.  Follow-up with podiatry.  Reasons to return to the emergency department and home  wound care instructions were discussed and all questions were answered.    Final Clinical Impressions(s) / ED Diagnoses   Final diagnoses:  Nail problem    ED Discharge Orders         Ordered    cephALEXin (KEFLEX) 500 MG capsule  3 times daily     10/19/18 1138           Sherlyne Crownover, Chase PicketJaime Pilcher, PA-C 10/19/18 1156    Alvira MondaySchlossman, Erin, MD 10/23/18 2216

## 2018-10-19 NOTE — Discharge Instructions (Signed)
It was my pleasure taking care of you today!   Please take all of your antibiotics until finished! This is to prevent infection.   Keep the nailbed clean, dry and covered.   Follow up with the podiatrist. Call to schedule an appointment.   Monitor for signs of infection as we discussed such as redness or drainage. Return to ER for these symptoms, new or worsening symptoms, any additional concerns.

## 2018-10-19 NOTE — ED Triage Notes (Signed)
Pt reports hitting his left great toe on the corner of his bed, the toe nail came off. Denise drainage or swelling. He is a diabetic.

## 2019-07-14 ENCOUNTER — Emergency Department (HOSPITAL_BASED_OUTPATIENT_CLINIC_OR_DEPARTMENT_OTHER)
Admission: EM | Admit: 2019-07-14 | Discharge: 2019-07-14 | Disposition: A | Discharge status: Home / Self Care | Payer: BLUE CROSS/BLUE SHIELD | Attending: Emergency Medicine | Admitting: Emergency Medicine

## 2019-07-14 ENCOUNTER — Encounter (HOSPITAL_BASED_OUTPATIENT_CLINIC_OR_DEPARTMENT_OTHER): Payer: Self-pay

## 2019-07-14 ENCOUNTER — Emergency Department (HOSPITAL_BASED_OUTPATIENT_CLINIC_OR_DEPARTMENT_OTHER): Payer: BLUE CROSS/BLUE SHIELD

## 2019-07-14 ENCOUNTER — Other Ambulatory Visit: Payer: Self-pay

## 2019-07-14 DIAGNOSIS — Z23 Encounter for immunization: Secondary | ICD-10-CM | POA: Insufficient documentation

## 2019-07-14 DIAGNOSIS — I2510 Atherosclerotic heart disease of native coronary artery without angina pectoris: Secondary | ICD-10-CM | POA: Diagnosis not present

## 2019-07-14 DIAGNOSIS — S300XXA Contusion of lower back and pelvis, initial encounter: Secondary | ICD-10-CM

## 2019-07-14 DIAGNOSIS — I130 Hypertensive heart and chronic kidney disease with heart failure and stage 1 through stage 4 chronic kidney disease, or unspecified chronic kidney disease: Secondary | ICD-10-CM | POA: Insufficient documentation

## 2019-07-14 DIAGNOSIS — M25512 Pain in left shoulder: Secondary | ICD-10-CM | POA: Diagnosis not present

## 2019-07-14 DIAGNOSIS — Y929 Unspecified place or not applicable: Secondary | ICD-10-CM | POA: Insufficient documentation

## 2019-07-14 DIAGNOSIS — Z7984 Long term (current) use of oral hypoglycemic drugs: Secondary | ICD-10-CM | POA: Insufficient documentation

## 2019-07-14 DIAGNOSIS — W07XXXA Fall from chair, initial encounter: Secondary | ICD-10-CM | POA: Diagnosis not present

## 2019-07-14 DIAGNOSIS — S3992XA Unspecified injury of lower back, initial encounter: Secondary | ICD-10-CM | POA: Diagnosis present

## 2019-07-14 DIAGNOSIS — Z79899 Other long term (current) drug therapy: Secondary | ICD-10-CM | POA: Insufficient documentation

## 2019-07-14 DIAGNOSIS — Z7982 Long term (current) use of aspirin: Secondary | ICD-10-CM | POA: Diagnosis not present

## 2019-07-14 DIAGNOSIS — N182 Chronic kidney disease, stage 2 (mild): Secondary | ICD-10-CM | POA: Diagnosis not present

## 2019-07-14 DIAGNOSIS — Y9389 Activity, other specified: Secondary | ICD-10-CM | POA: Diagnosis not present

## 2019-07-14 DIAGNOSIS — I5032 Chronic diastolic (congestive) heart failure: Secondary | ICD-10-CM | POA: Insufficient documentation

## 2019-07-14 DIAGNOSIS — Y999 Unspecified external cause status: Secondary | ICD-10-CM | POA: Diagnosis not present

## 2019-07-14 DIAGNOSIS — E1122 Type 2 diabetes mellitus with diabetic chronic kidney disease: Secondary | ICD-10-CM | POA: Insufficient documentation

## 2019-07-14 MED ORDER — BACITRACIN ZINC 500 UNIT/GM EX OINT
TOPICAL_OINTMENT | Freq: Once | CUTANEOUS | Status: AC
  Administered 2019-07-14: 15:00:00 via TOPICAL
  Filled 2019-07-14: qty 28.35

## 2019-07-14 MED ORDER — ACETAMINOPHEN 500 MG PO TABS: 1000.0000 mg | ORAL_TABLET | Freq: Once | ORAL | Status: DC

## 2019-07-14 MED ORDER — TETANUS-DIPHTH-ACELL PERTUSSIS 5-2.5-18.5 LF-MCG/0.5 IM SUSP
0.5000 mL | Freq: Once | INTRAMUSCULAR | Status: AC
  Administered 2019-07-14: 0.5 mL via INTRAMUSCULAR
  Filled 2019-07-14: qty 0.5

## 2019-07-14 MED ORDER — METHOCARBAMOL 500 MG PO TABS: 500.0000 mg | ORAL_TABLET | Freq: Two times a day (BID) | ORAL | 0 refills | Status: DC

## 2019-07-14 MED ORDER — METHOCARBAMOL 500 MG PO TABS: 500.0000 mg | ORAL_TABLET | Freq: Two times a day (BID) | ORAL | 0 refills | Status: AC

## 2019-07-14 NOTE — ED Triage Notes (Signed)
Pt states he fell 10/23-c/o pain to left shoulder and "cuts to my back"-NAD-steady gait

## 2019-07-14 NOTE — ED Notes (Signed)
Patient verbalizes understanding of discharge instructions. Opportunity for questioning and answers were provided. Armband removed by staff, pt discharged from ED ambulatory.   

## 2019-07-14 NOTE — Discharge Instructions (Signed)
You can take 1000 mg of Tylenol.  Do not exceed 4000 mg of Tylenol a day.  Take Robaxin as prescribed. This medication will make you drowsy so do not drive or drink alcohol when taking it.  Apply ice affected areas.  As we discussed, wear the shoulder sling for support and stabilization.  Make sure that you are taking it out of the sling and doing gentle range of motion exercises to prevent frozen shoulder syndrome.  As we discussed, I provided outpatient referral to orthopedics if your symptoms do not improve in the next 2 weeks.  Return the emergency department for any worsening pain, numbness/weakness of your arms, nausea/vomiting, chest pain, difficulty breathing.

## 2019-07-14 NOTE — ED Provider Notes (Signed)
[facility] EMERGENCY DEPARTMENT Provider Note   CSN: [id number] Arrival date & time: 07/14/19  1219     History   Chief Complaint Chief Complaint  Patient presents with   Fall    HPI Brett Flores is a 48 y.o. male past medical history of CHF, CKD, diabetes, hypertension who presents for evaluation of left shoulder pain and right lower back pain status post mechanical fall that occurred approximately 5 days ago.  Patient reports that he was standing on a chair in order to reach something.  He states that he slipped, causing him to fall.  He is unsure of how exactly he landed but denies any head injury or LOC.  He denies any preceding chest pain or dizziness that caused him to fall.  He states that since the incident, he has had pain to the left shoulder.  He states that the pain is worse with particular movements, particularly raising his arm above his head.  He states that he has not had any numbness.  He states that he has been taking 3 powder with no improvement.  Patient also reports some soreness to his right lower back.  He thinks he hit something when he fell and caused some scratches to his right lower back.  He reports he has been able to ambulate and bear weight since the incident.  Patient states he takes aspirin but no other blood thinners.  Denies any nausea/vomiting, numbness/weakness of his extremities, abdominal pain, difficulty breathing, chest pain.     The history is provided by the patient.    Past Medical History:  Diagnosis Date   CHF (congestive heart failure) (HCC)    CKD (chronic kidney disease), stage II    Coronary artery disease    Diabetes mellitus without complication (HCC)    Hypertension    Obesity     Patient Active Problem List   Diagnosis Date Noted   SOB (shortness of breath) 10/25/2015   Acute respiratory failure (HCC) 10/25/2015   CKD (chronic kidney disease), stage II    Hypertension    Diabetes mellitus without  complication (HCC)    Coronary artery disease    CHF (congestive heart failure) (HCC)    Obesity    Acute on chronic congestive heart failure (HCC)    Elevated troponin    Acute diastolic heart failure (HCC)    Shortness of breath 10/24/2015    History reviewed. No pertinent surgical history.      Home Medications    Prior to Admission medications   Medication Sig Start Date End Date Taking? Authorizing Provider  amLODipine (NORVASC) 10 MG tablet Take 1 tablet (10 mg total) by mouth daily. 10/26/15   Ghimire, Werner Lean, MD  aspirin EC 81 MG EC tablet Take 1 tablet (81 mg total) by mouth daily. 10/26/15   Ghimire, Werner Lean, MD  atorvastatin (LIPITOR) 20 MG tablet Take 1 tablet (20 mg total) by mouth daily. Reported on 10/25/2015 04/02/18   Geoffery Lyons, MD  cephALEXin (KEFLEX) 500 MG capsule Take 1 capsule (500 mg total) by mouth 3 (three) times daily. 10/19/18   Ward, Chase Picket, PA-C  cloNIDine (CATAPRES) 0.1 MG tablet Take 1 tablet (0.1 mg total) by mouth 2 (two) times daily. 04/02/18   Geoffery Lyons, MD  Exenatide ER (BYDUREON) 2 MG PEN Inject 2 mg into the skin once a week.    [provider]  furosemide (LASIX) 40 MG tablet Take 1 tablet (40 mg total) by mouth  daily. 04/02/18   Geoffery Lyonselo, Douglas, MD  hydrALAZINE (APRESOLINE) 25 MG tablet Take 1 tablet (25 mg total) by mouth 3 (three) times daily. 04/02/18   Geoffery Lyonselo, Douglas, MD  lisinopril (PRINIVIL,ZESTRIL) 40 MG tablet Take 1 tablet (40 mg total) by mouth daily. 04/02/18   Geoffery Lyonselo, Douglas, MD  metFORMIN (GLUCOPHAGE) 500 MG tablet Take 1 tablet (500 mg total) by mouth 2 (two) times daily with a meal. 04/02/18   Geoffery Lyonselo, Douglas, MD  methocarbamol (ROBAXIN) 500 MG tablet Take 1 tablet (500 mg total) by mouth 2 (two) times daily. 07/14/19   Maxwell CaulLayden, Imaad Reuss A, PA-C  metoprolol tartrate (LOPRESSOR) 100 MG tablet Take 1 tablet (100 mg total) by mouth 2 (two) times daily. 04/02/18   Geoffery Lyonselo, Douglas, MD  potassium chloride SA (K-DUR,KLOR-CON)  20 MEQ tablet Take 1 tablet (20 mEq total) by mouth daily. 04/02/18   Geoffery Lyonselo, Douglas, MD  sitaGLIPtin-metformin (JANUMET) 50-1000 MG tablet Take 1 tablet by mouth daily. 10/26/15   Ghimire, Werner LeanShanker M, MD    Family History Family History  Problem Relation Age of Onset   Heart disease Neg Hx    Heart failure Neg Hx    Hypertension Neg Hx     Social History Social History   Tobacco Use   Smoking status: Never Smoker   Smokeless tobacco: Never Used  Substance Use Topics   Alcohol use: No   Drug use: No     Allergies   Patient has no known allergies.   Review of Systems Review of Systems  Eyes: Negative for visual disturbance.  Respiratory: Negative for shortness of breath.   Cardiovascular: Negative for chest pain.  Gastrointestinal: Negative for abdominal pain, nausea and vomiting.  Genitourinary: Negative for dysuria and hematuria.  Musculoskeletal: Positive for back pain. Negative for neck pain.       Left shoulder pain  Skin: Positive for color change and wound.  Neurological: Negative for weakness and numbness.  All other systems reviewed and are negative.    Physical Exam Updated Vital Signs BP (!) 141/104    Pulse 80    Temp 98.7 F (37.1 C) (Oral)    Resp 16    Ht 6' (1.829 m)    Wt (!) 163.3 kg    SpO2 100%    BMI 48.82 kg/m   Physical Exam Vitals signs and nursing note reviewed.  Constitutional:      Appearance: He is well-developed.  HENT:     Head: Normocephalic and atraumatic.  Eyes:     General: No scleral icterus.       Right eye: No discharge.        Left eye: No discharge.     Conjunctiva/sclera: Conjunctivae normal.     Comments: PERRL. EOMs intact. No nystagmus. No neglect.   Neck:     Comments: Full flexion/extension and lateral movement of neck fully intact. No bony midline tenderness. No deformities or crepitus.  Cardiovascular:     Pulses:          Radial pulses are 2+ on the right side and 2+ on the left side.  Pulmonary:      Effort: Pulmonary effort is normal.     Comments: Lungs clear to auscultation bilaterally.  Symmetric chest rise.  No wheezing, rales, rhonchi. Abdominal:     Tenderness: There is no abdominal tenderness.     Comments: Abdomen is soft, non-distended, non-tender. No rigidity, No guarding. No peritoneal signs.  Musculoskeletal:       Back:  Comments: Tenderness palpation noted to anterior and lateral aspect of left shoulder.  No deformity or crepitus noted.  Limited range of motion secondary to pain.  He can achieve about 110 degrees of flexion before he starts experiencing pain.  Negative Neer's.  Positive Hawkins, liftoff, empty can test.  No bony tenderness noted to left elbow, left forearm, left wrist.  Full range of motion of right upper extremity with any difficulty.  Negative Neer's, Hawkins, liftoff, empty can test.  No midline T or L-spine tenderness.  No deformity or crepitus noted.  He has a 5 x 4 cm area of ecchymosis with overlying abrasions noted to the right buttock.  This is not overlying his midline spine or his lateral/anterior hip.  He has mild muscular tenderness overlying this area.  Skin:    General: Skin is warm and dry.  Neurological:     Mental Status: He is alert.     Comments: Follows commands, Moves all extremities  5/5 strength to BUE and BLE  Sensation intact throughout all major nerve distributions  Psychiatric:        Speech: Speech normal.        Behavior: Behavior normal.      ED Treatments / Results  Labs (all labs ordered are listed, but only abnormal results are displayed) Labs Reviewed - No data to display  EKG None  Radiology Dg Shoulder Left  Result Date: 07/14/2019 CLINICAL DATA:  Fall EXAM: LEFT SHOULDER - 2+ VIEW COMPARISON:  None. FINDINGS: There is no evidence of fracture or dislocation. There is no evidence of arthropathy or other focal bone abnormality. Soft tissues are unremarkable. IMPRESSION: Negative. Electronically Signed   By:  Prudencio Pair M.D.   On: 07/14/2019 12:56    Procedures Procedures (including critical care time)  Medications Ordered in ED Medications  acetaminophen (TYLENOL) tablet 1,000 mg (1,000 mg Oral Not Given 07/14/19 1506)  Tdap (BOOSTRIX) injection 0.5 mL (0.5 mLs Intramuscular Given 07/14/19 1507)  bacitracin ointment ( Topical Given 07/14/19 1507)     Initial Impression / Assessment and Plan / ED Course  I have reviewed the triage vital signs and the nursing notes.  Pertinent labs & imaging results that were available during my care of the patient were reviewed by me and considered in my medical decision making (see chart for details).        48 year old male who presents for evaluation after mechanical fall that occurred 5 days ago.  Reports he was standing on a chair when he fell.  Since then has had pain to his left shoulder and right lower back.  No head injury, LOC.  He is on aspirin but no other blood thinners.  No nausea/vomiting, numbness/weakness.  He has been taking Goody's powder with no improvement in symptoms. Patient is afebrile, non-toxic appearing, sitting comfortably on examination table. Vital signs reviewed and stable. Patient is neurovascularly intact.  Concern for left shoulder sprain versus fracture versus dislocation.  He also has tenderness palpation noted to the right gluteal area where he has ecchymosis and abrasions.  This does not look overlying any bony abnormality.  It is not overlying the flank that would be concerning for intra-abdominal hemorrhage.  He has no abdominal pain, ecchymosis noted to abdomen.  He denies any nausea/vomiting.  X-rays of shoulder ordered at triage.  At this time, he has no bony tenderness over his midline spine.  Do not feel that any imaging of thoracic or lumbar spine is indicated.  Additionally,  this is been 5 days since his incident.  He is only on aspirin denies any head injury or LOC.  He has not had any nausea/vomiting,  numbness/weakness.  He has been able to ambulate without any difficulty.  No indication for head CT at this time.  I do not suspect intracranial hemorrhage.  XRs reviewed.  Negative for any acute bony abnormality.  I discussed with patient that x-rays not show any muscle, ligamentous injury.  Specifically given exam, concern for possible tendon pathology versus rotator cuff etiology.  Will give shoulder sling and encourage patient to do gentle range of motion exercises at home.  Will give outpatient orthopedic referral if symptoms do not improve.  I offered muscle relaxers but patient declined. At this time, patient exhibits no emergent life-threatening condition that require further evaluation in ED. Patient had ample opportunity for questions and discussion. All patient's questions were answered with full understanding. Strict return precautions discussed. Patient expresses understanding and agreement to plan.   Portions of this note were generated with Scientist, clinical (histocompatibility and immunogenetics). Dictation errors may occur despite best attempts at proofreading.   Final Clinical Impressions(s) / ED Diagnoses   Final diagnoses:  Contusion of lower back, initial encounter  Acute pain of left shoulder    ED Discharge Orders         Ordered    methocarbamol (ROBAXIN) 500 MG tablet  2 times daily,   Status:  Discontinued     07/14/19 1454    methocarbamol (ROBAXIN) 500 MG tablet  2 times daily     07/14/19 1512           Rosana Hoes 07/14/19 1551    Vanetta Mulders, MD 07/22/19 [phone]
